# Patient Record
Sex: Female | Born: 1965 | Race: White | Hispanic: No | Marital: Married | Smoking: Never smoker
Health system: Southern US, Community
[De-identification: ages and names within clinical notes are randomized; demographics above are authoritative.]

## PROBLEM LIST (undated history)

## (undated) DIAGNOSIS — Z803 Family history of malignant neoplasm of breast: Principal | ICD-10-CM

## (undated) DIAGNOSIS — B019 Varicella without complication: Secondary | ICD-10-CM

## (undated) DIAGNOSIS — R87619 Unspecified abnormal cytological findings in specimens from cervix uteri: Secondary | ICD-10-CM

## (undated) DIAGNOSIS — Z808 Family history of malignant neoplasm of other organs or systems: Secondary | ICD-10-CM

## (undated) DIAGNOSIS — G43909 Migraine, unspecified, not intractable, without status migrainosus: Secondary | ICD-10-CM

## (undated) DIAGNOSIS — R002 Palpitations: Secondary | ICD-10-CM

## (undated) DIAGNOSIS — Z8042 Family history of malignant neoplasm of prostate: Secondary | ICD-10-CM

## (undated) DIAGNOSIS — Z8041 Family history of malignant neoplasm of ovary: Secondary | ICD-10-CM

## (undated) DIAGNOSIS — K219 Gastro-esophageal reflux disease without esophagitis: Secondary | ICD-10-CM

## (undated) DIAGNOSIS — T7840XA Allergy, unspecified, initial encounter: Secondary | ICD-10-CM

## (undated) HISTORY — PX: OTHER SURGICAL HISTORY: SHX169

## (undated) HISTORY — DX: Unspecified abnormal cytological findings in specimens from cervix uteri: R87.619

## (undated) HISTORY — PX: CERVICAL BIOPSY  W/ LOOP ELECTRODE EXCISION: SUR135

## (undated) HISTORY — DX: Family history of malignant neoplasm of other organs or systems: Z80.8

## (undated) HISTORY — DX: Family history of malignant neoplasm of breast: Z80.3

## (undated) HISTORY — DX: Palpitations: R00.2

## (undated) HISTORY — DX: Family history of malignant neoplasm of ovary: Z80.41

## (undated) HISTORY — DX: Family history of malignant neoplasm of prostate: Z80.42

## (undated) HISTORY — DX: Migraine, unspecified, not intractable, without status migrainosus: G43.909

## (undated) HISTORY — DX: Gastro-esophageal reflux disease without esophagitis: K21.9

## (undated) HISTORY — DX: Varicella without complication: B01.9

## (undated) HISTORY — DX: Allergy, unspecified, initial encounter: T78.40XA

---

## 2000-03-04 HISTORY — PX: OOPHORECTOMY: SHX86

## 2014-11-24 ENCOUNTER — Ambulatory Visit (INDEPENDENT_AMBULATORY_CARE_PROVIDER_SITE_OTHER): Payer: BLUE CROSS/BLUE SHIELD | Admitting: Family Medicine

## 2014-11-24 ENCOUNTER — Encounter: Payer: Self-pay | Admitting: Family Medicine

## 2014-11-24 ENCOUNTER — Other Ambulatory Visit (HOSPITAL_COMMUNITY)
Admission: RE | Admit: 2014-11-24 | Discharge: 2014-11-24 | Disposition: A | Discharge status: Home / Self Care | Payer: BLUE CROSS/BLUE SHIELD | Source: Ambulatory Visit | Attending: Family Medicine | Admitting: Family Medicine

## 2014-11-24 ENCOUNTER — Other Ambulatory Visit: Payer: Self-pay | Admitting: Family Medicine

## 2014-11-24 VITALS — BP 101/68 | HR 80 | Temp 97.2°F | Resp 16 | Ht 62.0 in | Wt 125.0 lb

## 2014-11-24 DIAGNOSIS — Z0000 Encounter for general adult medical examination without abnormal findings: Secondary | ICD-10-CM

## 2014-11-24 DIAGNOSIS — Z23 Encounter for immunization: Secondary | ICD-10-CM | POA: Diagnosis not present

## 2014-11-24 DIAGNOSIS — Z1151 Encounter for screening for human papillomavirus (HPV): Secondary | ICD-10-CM | POA: Insufficient documentation

## 2014-11-24 DIAGNOSIS — Z01411 Encounter for gynecological examination (general) (routine) with abnormal findings: Secondary | ICD-10-CM | POA: Insufficient documentation

## 2014-11-24 DIAGNOSIS — Z418 Encounter for other procedures for purposes other than remedying health state: Secondary | ICD-10-CM | POA: Diagnosis not present

## 2014-11-24 DIAGNOSIS — R002 Palpitations: Secondary | ICD-10-CM | POA: Diagnosis not present

## 2014-11-24 DIAGNOSIS — Z7689 Persons encountering health services in other specified circumstances: Secondary | ICD-10-CM

## 2014-11-24 DIAGNOSIS — Z124 Encounter for screening for malignant neoplasm of cervix: Secondary | ICD-10-CM | POA: Diagnosis not present

## 2014-11-24 DIAGNOSIS — Z7189 Other specified counseling: Secondary | ICD-10-CM

## 2014-11-24 DIAGNOSIS — Z1231 Encounter for screening mammogram for malignant neoplasm of breast: Secondary | ICD-10-CM

## 2014-11-24 DIAGNOSIS — Z299 Encounter for prophylactic measures, unspecified: Secondary | ICD-10-CM | POA: Insufficient documentation

## 2014-11-24 NOTE — Patient Instructions (Signed)

## 2014-11-24 NOTE — Progress Notes (Signed)
Pre visit review using our clinic review tool, if applicable. No additional management support is needed unless otherwise documented below in the visit note. 

## 2014-11-24 NOTE — Progress Notes (Signed)
Subjective:    Patient ID: Carla Watts, female    DOB: 06-Nov-1965, 49 y.o.   MRN: [id number]  HPI Patient presents for new patient establishment with preventative visit. All patient's past medical history, social history, family history, surgical history and allergies were entered into the electronic medical medical record today. Prior PCP records were received and reviewed.   Palpitations: Patient reports intermittent palpitations, that she felt was getting worse a few months ago. She denies any chest pain, dizziness, presyncope or syncope. She states that she doesn't drink a lot of caffeine. She does admit that she was under more anxiety at the time going through you instructor course. She does have a family history of thyroid disease, however by record review her TSH was checked 2 years ago and was normal.  Well woman/cervical cancer screening:  Patient's last menstrual period was 11/11/2014. she reports her periods come regularly, every 28-30 days, lasting about 5 days and are not heavy. She does not use any form of birth control. Her husband had a vasectomy. She is married. He is a gravida 5 para 3, youngest child is 63. Patient does not routinely perform self breast exams. Her last Pap smear within 2014 and was normal. He states that she has had all normal Pap smears since her early 58s. She did have a LEEP cone in her early 20s. She has had HIV testing with her pregnancy. Her last mammogram was in 2014 and was normal. She had had a abnormal mammogram in 2013, that was followed up on and was found to be benign axillary lymph nodes. Patient has no family history of breast or colon cancer. Patient does have a family history of ovarian cancer. Patient has had a right oophorectomy  secondary to cyst that was benign.   Past Medical History  Diagnosis Date  . Chicken pox   . GERD (gastroesophageal reflux disease)    Allergies  Allergen Reactions  . Penicillins Rash   Past Surgical History    Procedure Laterality Date  . Oophorectomy Right 2002  . Cervical biopsy  w/ loop electrode excision    . Cesarean section    . Laparoscopy of uterus     Family History  Problem Relation Watts of Onset  . Hypertension Mother   . Thyroid disease Mother   . Stroke Father   . Heart attack Maternal Grandfather   . Heart disease Maternal Grandfather 101    Died of MI  . Arthritis Maternal Grandmother     RA  . Thyroid disease Maternal Grandmother   . Alcohol abuse Paternal Grandfather   . Ovarian cancer Paternal Aunt   . Brain cancer Paternal Uncle   . Thyroid disease Maternal Aunt    Social History   Social History  . Marital Status: Married    Spouse Name: N/A  . Number of Children: N/A  . Years of Education: N/A   Occupational History  . health fitness coach     yoga instructor   Social History Main Topics  . Smoking status: Never Smoker   . Smokeless tobacco: Never Used  . Alcohol Use: Yes     Comment: socially  . Drug Use: No  . Sexual Activity: Yes     Comment: husband with vasectomy   Other Topics Concern  . Not on file   Social History Narrative   Married, 3 children.   - drinks some caffeine.   - uses herbal remedies at time.    - Wears  a seatbelt, exercises 3x/w, smoke alarm in the home   - Guns in the home, locked case   - Feels safe in relationship    Health maintenance:  Colonoscopy: No fhx, routine screening to start at 50y Mammogram: Last mammogram 2014, normal. There was benign findings in 2013 mammogram, that was followed up on. Felt to be benign lymph nodes in the axillary region. Cervical cancer screening: Last Pap smear 2014, normal. Immunizations: Flu vaccination and tetanus vaccination ordered today. Infectious disease screening: HIV completed with last pregnancy, negative.  Review of Systems  Constitutional: Negative for fever, activity change, appetite change, fatigue and unexpected weight change.  HENT: Negative for ear pain, mouth  sores, sore throat, tinnitus, trouble swallowing and voice change.   Eyes: Negative for photophobia and visual disturbance.  Respiratory: Positive for shortness of breath. Negative for cough, choking, chest tightness and wheezing.   Cardiovascular: Positive for palpitations. Negative for chest pain and leg swelling.  Gastrointestinal: Negative for abdominal pain, diarrhea, constipation, blood in stool and rectal pain.  Endocrine: Negative for cold intolerance, heat intolerance and polyuria.  Genitourinary: Negative for decreased urine volume, difficulty urinating, vaginal pain and menstrual problem.  Musculoskeletal: Negative for myalgias, back pain, arthralgias and gait problem.  Skin: Negative for rash.  Allergic/Immunologic: Negative for environmental allergies and food allergies.  Neurological: Negative for dizziness, syncope, speech difficulty, weakness, light-headedness and headaches.  Hematological: Negative for adenopathy.  Psychiatric/Behavioral: Negative for hallucinations, confusion, sleep disturbance, decreased concentration and agitation. The patient is not nervous/anxious.       Objective:   Physical Exam BP 101/68 mmHg  Pulse 80  Temp(Src) 97.2 F (36.2 C) (Oral)  Resp 16  Ht  (1.575 m)  Wt 125 lb (56.7 kg)  BMI 22.86 kg/m2  SpO2 99%  LMP 11/11/2014 Gen: Afebrile. No acute distress. Non-toxic in appearance, physically fit caucasian female.  HENT: AT. [place]. Bilateral TM visualized and normal in appearance. MMM. Bilateral nares without erythema or swelling. Throat without erythema or exudates.  Eyes:Pupils Equal Round Reactive to light, Extraocular movements intact,  Conjunctiva without redness, discharge or icterus. Neck/lymp/endocrine: Supple,no lymphadenopathy, no thyromegaly CV: RRR no murmur, no edema, +2/4 P posterior tibialis pulses Chest: CTAB, no wheeze or crackles Abd: Soft. flat. NTND. BS present, no Masses palpated.  Skin: No rashes, purpura or petechiae.   Neuro/MSK: Normal gait. PERLA. EOMi. Alert. Oriented x3 Cranial nerves II through XII intact. Muscle strength 5/5 UE/LE extremity. DTRs equal bilaterally. Psych: Normal affect, dress and demeanor. Normal speech. Normal thought content and judgment.  GYN:  External genitalia within normal limits.  Vaginal mucosa pink, moist, normal rugae.  Nonfriable cervix without lesions, mild white thick discharge, mildly friable cervix with nabothian cyst 11o'clock positron.   Bimanual exam revealed normal, nongravid uterus.  No cervical motion tenderness. No adnexal masses bilaterally.      Assessment & Plan:  1. Preventive measure Colonoscopy: No fhx, routine screening to start at 50y Mammogram: Last mammogram 2014, normal. There was benign findings in 2013 mammogram, that was followed up on. Felt to be benign lymph nodes in the axillary region. Mammogram ordered today. Cervical cancer screening: Last Pap smear 2014, normal. Pap smear completed today, with HPV. Immunizations: tdap administered today, patient declined flu shot. Infectious disease screening: HIV completed with last pregnancy, negative. - Tdap vaccine greater than or equal to 7yo IM - Lipid panel; Future - Vit D  25 hydroxy (rtn osteoporosis monitoring); Future - MM DIGITAL SCREENING BILATERAL; Future  2.  Palpitations - Family history of thyroid disorder, doesn't appear to drink much caffeine. No alarming symptoms, and no current symptoms. Discussed causes of palpitations with patient today and alarm signs to look for if she starts to get palpitations again. We'll do some lab work today to rule out thyroid disorder. - TSH; Future - CBC w/Diff; Future - Comprehensive metabolic panel; Future  Follow-up in one year, sooner if needed or as lab results indicate.

## 2014-11-25 ENCOUNTER — Other Ambulatory Visit (INDEPENDENT_AMBULATORY_CARE_PROVIDER_SITE_OTHER): Payer: BLUE CROSS/BLUE SHIELD

## 2014-11-25 ENCOUNTER — Telehealth: Payer: Self-pay | Admitting: Family Medicine

## 2014-11-25 DIAGNOSIS — R002 Palpitations: Secondary | ICD-10-CM | POA: Diagnosis not present

## 2014-11-25 DIAGNOSIS — Z299 Encounter for prophylactic measures, unspecified: Secondary | ICD-10-CM

## 2014-11-25 DIAGNOSIS — Z418 Encounter for other procedures for purposes other than remedying health state: Secondary | ICD-10-CM

## 2014-11-25 DIAGNOSIS — E559 Vitamin D deficiency, unspecified: Secondary | ICD-10-CM

## 2014-11-25 LAB — LIPID PANEL
CHOLESTEROL: 161 mg/dL (ref 0–200)
HDL: 84.2 mg/dL (ref >39.00)
LDL Cholesterol: 67 mg/dL (ref 0–99)
NONHDL: 76.92
TRIGLYCERIDES: 48 mg/dL (ref 0.0–149.0)
Total CHOL/HDL Ratio: 2
VLDL: 9.6 mg/dL (ref 0.0–40.0)

## 2014-11-25 LAB — CBC WITH DIFFERENTIAL/PLATELET
BASOS ABS: 0 10*3/uL (ref 0.0–0.1)
Basophils Relative: 0.7 % (ref 0.0–3.0)
EOS PCT: 0.9 % (ref 0.0–5.0)
Eosinophils Absolute: 0.1 10*3/uL (ref 0.0–0.7)
HCT: 40.9 % (ref 36.0–46.0)
Hemoglobin: 13.4 g/dL (ref 12.0–15.0)
LYMPHS ABS: 2.1 10*3/uL (ref 0.7–4.0)
Lymphocytes Relative: 31.6 % (ref 12.0–46.0)
MCHC: 32.7 g/dL (ref 30.0–36.0)
MCV: 90.1 fl (ref 78.0–100.0)
MONO ABS: 0.6 10*3/uL (ref 0.1–1.0)
MONOS PCT: 8.9 % (ref 3.0–12.0)
NEUTROS ABS: 3.9 10*3/uL (ref 1.4–7.7)
NEUTROS PCT: 57.9 % (ref 43.0–77.0)
PLATELETS: 239 10*3/uL (ref 150.0–400.0)
RBC: 4.54 Mil/uL (ref 3.87–5.11)
RDW: 13.2 % (ref 11.5–15.5)
WBC: 6.7 10*3/uL (ref 4.0–10.5)

## 2014-11-25 LAB — COMPREHENSIVE METABOLIC PANEL
ALBUMIN: 4.3 g/dL (ref 3.5–5.2)
ALT: 10 U/L (ref 0–35)
AST: 16 U/L (ref 0–37)
Alkaline Phosphatase: 48 U/L (ref 39–117)
BUN: 12 mg/dL (ref 6–23)
CALCIUM: 9.3 mg/dL (ref 8.4–10.5)
CHLORIDE: 105 meq/L (ref 96–112)
CO2: 27 meq/L (ref 19–32)
Creatinine, Ser: 0.78 mg/dL (ref 0.40–1.20)
GFR: 83.76 mL/min (ref >60.00)
Glucose, Bld: 93 mg/dL (ref 70–99)
POTASSIUM: 4.6 meq/L (ref 3.5–5.1)
Sodium: 139 mEq/L (ref 135–145)
Total Bilirubin: 0.5 mg/dL (ref 0.2–1.2)
Total Protein: 6.7 g/dL (ref 6.0–8.3)

## 2014-11-25 LAB — TSH: TSH: 1.46 u[IU]/mL (ref 0.35–4.50)

## 2014-11-25 LAB — VITAMIN D 25 HYDROXY (VIT D DEFICIENCY, FRACTURES): VITD: 26.09 ng/mL — AB (ref 30.00–100.00)

## 2014-11-25 MED ORDER — VITAMIN D (ERGOCALCIFEROL) 1.25 MG (50000 UNIT) PO CAPS: 50000.0000 [IU] | ORAL_CAPSULE | ORAL | Status: DC

## 2014-11-25 NOTE — Telephone Encounter (Signed)
Please call patient, her vitamin D is low at 26. I have called in a prescription a vitamin D for her to take 1 pill every 7 days for 12 weeks. We would then retest her vitamin D level. All of her other lab work was perfect.   Her cervical cancer screening has not returned , we will call her once it has result.She is not signed up for my chart, and she may need copies of these for her insurance.

## 2014-11-28 LAB — CYTOLOGY - PAP

## 2014-11-28 NOTE — Telephone Encounter (Signed)
LMOM for pt to CB for results. 

## 2014-11-28 NOTE — Telephone Encounter (Signed)
Patient aware of results and new RX.  No questions at this time.

## 2014-11-29 ENCOUNTER — Telehealth: Payer: Self-pay | Admitting: Family Medicine

## 2014-11-29 NOTE — Telephone Encounter (Signed)
Patient aware of results. No questions at this time.

## 2014-11-29 NOTE — Telephone Encounter (Signed)
Please call pt: her PAP was normal , with negative HPV. This means her next PAP is in 3 years.

## 2014-12-23 ENCOUNTER — Ambulatory Visit
Admission: RE | Admit: 2014-12-23 | Discharge: 2014-12-23 | Disposition: A | Discharge status: Home / Self Care | Payer: BLUE CROSS/BLUE SHIELD | Source: Ambulatory Visit | Attending: Family Medicine | Admitting: Family Medicine

## 2014-12-23 DIAGNOSIS — Z1231 Encounter for screening mammogram for malignant neoplasm of breast: Secondary | ICD-10-CM

## 2014-12-28 ENCOUNTER — Telehealth: Payer: Self-pay | Admitting: Family Medicine

## 2014-12-28 NOTE — Telephone Encounter (Signed)
Can we check on this mammogram result? It states "exam ended" 5 days ago, but I am unable to retrieve the result.

## 2014-12-28 NOTE — Telephone Encounter (Signed)
[facility] results have not been dictated yet. Dr Claiborne BillingsKuneff notified

## 2015-01-03 ENCOUNTER — Other Ambulatory Visit: Payer: Self-pay | Admitting: Family Medicine

## 2015-01-03 DIAGNOSIS — R928 Other abnormal and inconclusive findings on diagnostic imaging of breast: Secondary | ICD-10-CM

## 2015-01-17 ENCOUNTER — Ambulatory Visit
Admission: RE | Admit: 2015-01-17 | Discharge: 2015-01-17 | Disposition: A | Discharge status: Home / Self Care | Payer: BLUE CROSS/BLUE SHIELD | Source: Ambulatory Visit | Attending: Family Medicine | Admitting: Family Medicine

## 2015-01-17 DIAGNOSIS — R928 Other abnormal and inconclusive findings on diagnostic imaging of breast: Secondary | ICD-10-CM

## 2015-07-05 ENCOUNTER — Encounter: Payer: Self-pay | Admitting: Family Medicine

## 2015-07-05 ENCOUNTER — Ambulatory Visit (INDEPENDENT_AMBULATORY_CARE_PROVIDER_SITE_OTHER): Payer: BLUE CROSS/BLUE SHIELD | Admitting: Family Medicine

## 2015-07-05 VITALS — BP 110/70 | HR 82 | Temp 98.0°F | Resp 16 | Wt 125.2 lb

## 2015-07-05 DIAGNOSIS — M25579 Pain in unspecified ankle and joints of unspecified foot: Secondary | ICD-10-CM | POA: Insufficient documentation

## 2015-07-05 DIAGNOSIS — M25572 Pain in left ankle and joints of left foot: Secondary | ICD-10-CM

## 2015-07-05 DIAGNOSIS — M7672 Peroneal tendinitis, left leg: Secondary | ICD-10-CM | POA: Diagnosis not present

## 2015-07-05 MED ORDER — MELOXICAM 15 MG PO TABS: 15.0000 mg | ORAL_TABLET | Freq: Every day | ORAL | Status: DC

## 2015-07-05 NOTE — Assessment & Plan Note (Signed)
New.  Pt's pain is localized over L peronal tendons and consistent w/ inflammation.  Start daily mobic.  Encouraged supportive shoes,ice prn.  If no improvement, will refer to sports med.  Pt expressed understanding and is in agreement w/ plan.

## 2015-07-05 NOTE — Assessment & Plan Note (Signed)
New.  Pt w/ new area of soft tissue swelling on dorsum of foot just distal to the ankle mortis.  Get US to assess but this seems most consistent w/ ganglion cyst.  Pt expressed understanding and is in agreement w/ plan.

## 2015-07-05 NOTE — Progress Notes (Signed)
Pre visit review using our clinic review tool, if applicable. No additional management support is needed unless otherwise documented below in the visit note. 

## 2015-07-05 NOTE — Progress Notes (Signed)
   Subjective:    Patient ID: Carla Watts Agehristine Watts, female    DOB: 12/10/1965, 50 y.o.   MRN: [id number]  HPI L ankle pain- pt reports 'there is pain that shouldn't be there'.  sxs started 'a couple of months ago'.  No restriction in motion.  This weekend developed a tender lump on the top of her foot near the ankle mortis.  Pt reports increased cracking and popping but this is painless.  No recent change in footwear.  No change in activity level.  Pt notes very mild swelling.  No pain w/ weight bearing.  Most painful when doing yoga and having to tuck foot underneath her (foot in inverted position)  Pt has not taken any OTC meds for pain.    Review of Systems For ROS see HPI     Objective:   Physical Exam  Constitutional: She is oriented to person, place, and time. She appears well-developed and well-nourished. No distress.  Cardiovascular: Intact distal pulses.   Musculoskeletal: She exhibits tenderness (mild TTP over L peronal tendons.  no TTP over medial or lateral malleolus.  mild TTP over soft tissue mass on dorsum of foot just distal to the ankle mortis- consistent w/ ganglion cyst). She exhibits no edema.  Full ROM of L ankle w/ pain  Neurological: She is alert and oriented to person, place, and time. Coordination normal.  Skin: Skin is warm and dry. No rash noted. No erythema.  Psychiatric: She has a normal mood and affect. Her behavior is normal. Thought content normal.  Vitals reviewed.         Assessment & Plan:

## 2015-07-05 NOTE — Patient Instructions (Signed)
Follow up as needed Take the Mobic once daily- take w/ food- for 7-10 days and then as needed for pain Wear good supportive shoes to allow the inflammation to calm down We'll call you with your ultrasound appt to assess the top of the foot- my best guess is a ganglion cyst ICE for pain as needed Call with any questions or concerns Hang in there!!!

## 2015-07-07 DIAGNOSIS — M9902 Segmental and somatic dysfunction of thoracic region: Secondary | ICD-10-CM | POA: Diagnosis not present

## 2015-07-07 DIAGNOSIS — M9901 Segmental and somatic dysfunction of cervical region: Secondary | ICD-10-CM | POA: Diagnosis not present

## 2015-07-07 DIAGNOSIS — M531 Cervicobrachial syndrome: Secondary | ICD-10-CM | POA: Diagnosis not present

## 2015-07-07 DIAGNOSIS — M791 Myalgia: Secondary | ICD-10-CM | POA: Diagnosis not present

## 2015-08-08 ENCOUNTER — Telehealth: Payer: Self-pay | Admitting: *Deleted

## 2015-08-08 ENCOUNTER — Ambulatory Visit (INDEPENDENT_AMBULATORY_CARE_PROVIDER_SITE_OTHER): Payer: BLUE CROSS/BLUE SHIELD | Admitting: Family Medicine

## 2015-08-08 ENCOUNTER — Encounter: Payer: Self-pay | Admitting: Family Medicine

## 2015-08-08 VITALS — BP 90/62 | HR 62 | Temp 98.2°F | Resp 18 | Ht 62.0 in | Wt 120.5 lb

## 2015-08-08 DIAGNOSIS — T148 Other injury of unspecified body region: Secondary | ICD-10-CM

## 2015-08-08 DIAGNOSIS — W57XXXA Bitten or stung by nonvenomous insect and other nonvenomous arthropods, initial encounter: Secondary | ICD-10-CM | POA: Diagnosis not present

## 2015-08-08 MED ORDER — DOXYCYCLINE HYCLATE 100 MG PO TABS: ORAL_TABLET | ORAL | Status: DC

## 2015-08-08 NOTE — Patient Instructions (Signed)
Tick Bite Information Ticks are insects that attach themselves to the skin and draw blood for food. There are various types of ticks. Common types include wood ticks and deer ticks. Most ticks live in shrubs and grassy areas. Ticks can climb onto your body when you make contact with leaves or grass where the tick is waiting. The most common places on the body for ticks to attach themselves are the scalp, neck, armpits, waist, and groin. Most tick bites are harmless, but sometimes ticks carry germs that cause diseases. These germs can be spread to a person during the tick's feeding process. The chance of a disease spreading through a tick bite depends on:   The type of tick.  Time of year.   How long the tick is attached.   Geographic location.  HOW CAN YOU PREVENT TICK BITES? Take these steps to help prevent tick bites when you are outdoors:  Wear protective clothing. Long sleeves and long pants are best.   Wear white clothes so you can see ticks more easily.  Tuck your pant legs into your socks.   If walking on a trail, stay in the middle of the trail to avoid brushing against bushes.  Avoid walking through areas with long grass.  Put insect repellent on all exposed skin and along boot tops, pant legs, and sleeve cuffs.   Check clothing, hair, and skin repeatedly and before going inside.   Brush off any ticks that are not attached.  Take a shower or bath as soon as possible after being outdoors.  WHAT IS THE PROPER WAY TO REMOVE A TICK? Ticks should be removed as soon as possible to help prevent diseases caused by tick bites. 1. If latex gloves are available, put them on before trying to remove a tick.  2. Using fine-point tweezers, grasp the tick as close to the skin as possible. You may also use curved forceps or a tick removal tool. Grasp the tick as close to its head as possible. Avoid grasping the tick on its body. 3. Pull gently with steady upward pressure until  the tick lets go. Do not twist the tick or jerk it suddenly. This may break off the tick's head or mouth parts. 4. Do not squeeze or crush the tick's body. This could force disease-carrying fluids from the tick into your body.  5. After the tick is removed, wash the bite area and your hands with soap and water or other disinfectant such as alcohol. 6. Apply a small amount of antiseptic cream or ointment to the bite site.  7. Wash and disinfect any instruments that were used.  Do not try to remove a tick by applying a hot match, petroleum jelly, or fingernail polish to the tick. These methods do not work and may increase the chances of disease being spread from the tick bite.  WHEN SHOULD YOU SEEK MEDICAL CARE? Contact your health care provider if you are unable to remove a tick from your skin or if a part of the tick breaks off and is stuck in the skin.  After a tick bite, you need to be aware of signs and symptoms that could be related to diseases spread by ticks. Contact your health care provider if you develop any of the following in the days or weeks after the tick bite:  Unexplained fever.  Rash. A circular rash that appears days or weeks after the tick bite may indicate the possibility of Lyme disease. The rash may resemble   a target with a bull's-eye and may occur at a different part of your body than the tick bite.  Redness and swelling in the area of the tick bite.   Tender, swollen lymph glands.   Diarrhea.   Weight loss.   Cough.   Fatigue.   Muscle, joint, or bone pain.   Abdominal pain.   Headache.   Lethargy or a change in your level of consciousness.  Difficulty walking or moving your legs.   Numbness in the legs.   Paralysis.  Shortness of breath.   Confusion.   Repeated vomiting.    This information is not intended to replace advice given to you by your health care provider. Make sure you discuss any questions you have with your health  care provider.   Document Released: 02/16/2000 Document Revised: 03/11/2014 Document Reviewed: 07/29/2012 Elsevier Interactive Patient Education 2016 [employer]  

## 2015-08-08 NOTE — Telephone Encounter (Signed)
Patient called left message stating she had a tick bite last night. Her husband got the tick off and the head out. She is requesting antibiotics. She states it said on the news you should call your Dr and get antibiotics if bitten by a tick. Patient scheduled to be seen today.

## 2015-08-08 NOTE — Progress Notes (Signed)
Patient ID: Carla Watts, female   DOB: Jul 06, 1965, 50 y.o.   MRN: [id number]    Carla Watts , 1965-05-22, 50 y.o., female MRN: [id number]  CC: Tick bite Subjective: Pt presents for an acute OV with complaints of tick bite of 1 day duration. No Associated symptoms. Patient reports she noticed the tick on her right breast last night and her husband removed the tick with tweezers. The tick was not engorged, however the head of the insect separated from the body. They felt they were able to get the remaining pieces of the insect removed. She denies rash, redness, fever, chills, drainage, or headache. She does not feel the tick was on her skin more than 24 hours, but is uncertain. She was not outside working, but her husband had been over the weekend and she has 2 dogs that are indoor/outdoor.    Allergies  Allergen Reactions  . Penicillins Rash   Social History  Substance Use Topics  . Smoking status: Never Smoker   . Smokeless tobacco: Never Used  . Alcohol Use: Yes     Comment: socially   Past Medical History  Diagnosis Date  . Chicken pox   . GERD (gastroesophageal reflux disease)    Past Surgical History  Procedure Laterality Date  . Oophorectomy Right 2002  . Cervical biopsy  w/ loop electrode excision    . Cesarean section    . Laparoscopy of uterus     Family History  Problem Relation Age of Onset  . Hypertension Mother   . Thyroid disease Mother   . Stroke Father   . Heart attack Maternal Grandfather   . Heart disease Maternal Grandfather 9    Died of MI  . Arthritis Maternal Grandmother     RA  . Thyroid disease Maternal Grandmother   . Alcohol abuse Paternal Grandfather   . Ovarian cancer Paternal Aunt   . Brain cancer Paternal Uncle   . Thyroid disease Maternal Aunt      Medication List       This list is accurate as of: 08/08/15  3:16 PM.  Always use your most recent med list.               meloxicam 15 MG tablet  Commonly known as:  MOBIC  Take  1 tablet (15 mg total) by mouth daily.         ROS: Negative, with the exception of above mentioned in HPI   Objective:  BP 90/62 mmHg  Pulse 62  Temp(Src) 98.2 F (36.8 C)  Resp 18  Ht  (1.575 m)  Wt 120 lb 8 oz (54.658 kg)  BMI 22.03 kg/m2  SpO2 96%  LMP 07/31/2015 Body mass index is 22.03 kg/(m^2). Gen: Afebrile. No acute distress. Nontoxic in appearance, well developed. well nourished. Pleasant female.  HENT: AT. [place].  MMM, no oral lesions.  Eyes:Pupils Equal Round Reactive to light, Extraocular movements intact,  Conjunctiva without redness, discharge or icterus. Skin: No rashes, purpura or petechiae. Small insect bite right areolar. No redness or drainage. No remnants of insect identified.  Neuro: Normal gait. PERLA. EOMi. Alert. Oriented x3   Assessment/Plan: Carla Watts is a 50 y.o. female present for acute OV for  Tick bite - Tick bite right areolar, does not appear infected.  - Doxy 200 mg PO once.  - doxycycline (VIBRA-TABS) 100 MG tablet; 200 mg PO once  Dispense: 2 tablet; Refill: 0 - AVS on tick bites/prevention and emergent signs.  -  F/u PRN   electronically signed by:  Felix Pacinienee Kuneff, DO   [facility] [place]

## 2015-09-14 ENCOUNTER — Ambulatory Visit (HOSPITAL_BASED_OUTPATIENT_CLINIC_OR_DEPARTMENT_OTHER): Payer: BLUE CROSS/BLUE SHIELD

## 2015-09-15 DIAGNOSIS — M531 Cervicobrachial syndrome: Secondary | ICD-10-CM | POA: Diagnosis not present

## 2015-09-15 DIAGNOSIS — M9901 Segmental and somatic dysfunction of cervical region: Secondary | ICD-10-CM | POA: Diagnosis not present

## 2015-09-15 DIAGNOSIS — M9902 Segmental and somatic dysfunction of thoracic region: Secondary | ICD-10-CM | POA: Diagnosis not present

## 2015-09-15 DIAGNOSIS — M791 Myalgia: Secondary | ICD-10-CM | POA: Diagnosis not present

## 2015-12-06 DIAGNOSIS — M791 Myalgia: Secondary | ICD-10-CM | POA: Diagnosis not present

## 2015-12-06 DIAGNOSIS — M531 Cervicobrachial syndrome: Secondary | ICD-10-CM | POA: Diagnosis not present

## 2015-12-06 DIAGNOSIS — M9901 Segmental and somatic dysfunction of cervical region: Secondary | ICD-10-CM | POA: Diagnosis not present

## 2015-12-06 DIAGNOSIS — M9902 Segmental and somatic dysfunction of thoracic region: Secondary | ICD-10-CM | POA: Diagnosis not present

## 2015-12-25 DIAGNOSIS — M9902 Segmental and somatic dysfunction of thoracic region: Secondary | ICD-10-CM | POA: Diagnosis not present

## 2015-12-25 DIAGNOSIS — M531 Cervicobrachial syndrome: Secondary | ICD-10-CM | POA: Diagnosis not present

## 2015-12-25 DIAGNOSIS — M9901 Segmental and somatic dysfunction of cervical region: Secondary | ICD-10-CM | POA: Diagnosis not present

## 2015-12-25 DIAGNOSIS — R51 Headache: Secondary | ICD-10-CM | POA: Diagnosis not present

## 2015-12-29 DIAGNOSIS — R51 Headache: Secondary | ICD-10-CM | POA: Diagnosis not present

## 2015-12-29 DIAGNOSIS — M9902 Segmental and somatic dysfunction of thoracic region: Secondary | ICD-10-CM | POA: Diagnosis not present

## 2015-12-29 DIAGNOSIS — M9901 Segmental and somatic dysfunction of cervical region: Secondary | ICD-10-CM | POA: Diagnosis not present

## 2015-12-29 DIAGNOSIS — M531 Cervicobrachial syndrome: Secondary | ICD-10-CM | POA: Diagnosis not present

## 2016-01-05 DIAGNOSIS — M9902 Segmental and somatic dysfunction of thoracic region: Secondary | ICD-10-CM | POA: Diagnosis not present

## 2016-01-05 DIAGNOSIS — M9901 Segmental and somatic dysfunction of cervical region: Secondary | ICD-10-CM | POA: Diagnosis not present

## 2016-01-05 DIAGNOSIS — R51 Headache: Secondary | ICD-10-CM | POA: Diagnosis not present

## 2016-01-05 DIAGNOSIS — M531 Cervicobrachial syndrome: Secondary | ICD-10-CM | POA: Diagnosis not present

## 2016-01-10 ENCOUNTER — Telehealth: Payer: Self-pay | Admitting: Family Medicine

## 2016-01-10 NOTE — Telephone Encounter (Signed)
Phone note in error °

## 2016-01-12 ENCOUNTER — Encounter: Payer: Self-pay | Admitting: Family Medicine

## 2016-01-12 ENCOUNTER — Ambulatory Visit (INDEPENDENT_AMBULATORY_CARE_PROVIDER_SITE_OTHER): Payer: BLUE CROSS/BLUE SHIELD | Admitting: Family Medicine

## 2016-01-12 VITALS — BP 118/83 | HR 71 | Temp 98.6°F | Resp 20 | Ht 62.0 in | Wt 124.5 lb

## 2016-01-12 DIAGNOSIS — M9901 Segmental and somatic dysfunction of cervical region: Secondary | ICD-10-CM | POA: Diagnosis not present

## 2016-01-12 DIAGNOSIS — Z131 Encounter for screening for diabetes mellitus: Secondary | ICD-10-CM

## 2016-01-12 DIAGNOSIS — Z0000 Encounter for general adult medical examination without abnormal findings: Secondary | ICD-10-CM

## 2016-01-12 DIAGNOSIS — M7672 Peroneal tendinitis, left leg: Secondary | ICD-10-CM | POA: Diagnosis not present

## 2016-01-12 DIAGNOSIS — E559 Vitamin D deficiency, unspecified: Secondary | ICD-10-CM | POA: Diagnosis not present

## 2016-01-12 DIAGNOSIS — Z130 Encounter for screening for diseases of the blood and blood-forming organs and certain disorders involving the immune mechanism: Secondary | ICD-10-CM | POA: Diagnosis not present

## 2016-01-12 DIAGNOSIS — Z13220 Encounter for screening for lipoid disorders: Secondary | ICD-10-CM

## 2016-01-12 DIAGNOSIS — M9902 Segmental and somatic dysfunction of thoracic region: Secondary | ICD-10-CM | POA: Diagnosis not present

## 2016-01-12 DIAGNOSIS — Z1329 Encounter for screening for other suspected endocrine disorder: Secondary | ICD-10-CM

## 2016-01-12 DIAGNOSIS — R51 Headache: Secondary | ICD-10-CM | POA: Diagnosis not present

## 2016-01-12 DIAGNOSIS — Z1231 Encounter for screening mammogram for malignant neoplasm of breast: Secondary | ICD-10-CM

## 2016-01-12 DIAGNOSIS — M531 Cervicobrachial syndrome: Secondary | ICD-10-CM | POA: Diagnosis not present

## 2016-01-12 LAB — LIPID PANEL
CHOL/HDL RATIO: 2
Cholesterol: 180 mg/dL (ref 0–200)
HDL: 96.4 mg/dL (ref >39.00)
LDL CALC: 76 mg/dL (ref 0–99)
NonHDL: 83.11
TRIGLYCERIDES: 38 mg/dL (ref 0.0–149.0)
VLDL: 7.6 mg/dL (ref 0.0–40.0)

## 2016-01-12 LAB — COMPREHENSIVE METABOLIC PANEL
ALT: 11 U/L (ref 0–35)
AST: 20 U/L (ref 0–37)
Albumin: 4.3 g/dL (ref 3.5–5.2)
Alkaline Phosphatase: 43 U/L (ref 39–117)
BILIRUBIN TOTAL: 0.6 mg/dL (ref 0.2–1.2)
BUN: 17 mg/dL (ref 6–23)
CO2: 27 meq/L (ref 19–32)
Calcium: 9.5 mg/dL (ref 8.4–10.5)
Chloride: 103 mEq/L (ref 96–112)
Creatinine, Ser: 0.71 mg/dL (ref 0.40–1.20)
GFR: 92.92 mL/min (ref >60.00)
GLUCOSE: 82 mg/dL (ref 70–99)
Potassium: 4.3 mEq/L (ref 3.5–5.1)
SODIUM: 139 meq/L (ref 135–145)
Total Protein: 6.8 g/dL (ref 6.0–8.3)

## 2016-01-12 LAB — CBC WITH DIFFERENTIAL/PLATELET
BASOS ABS: 0 10*3/uL (ref 0.0–0.1)
Basophils Relative: 0.7 % (ref 0.0–3.0)
EOS ABS: 0.1 10*3/uL (ref 0.0–0.7)
Eosinophils Relative: 1.4 % (ref 0.0–5.0)
HCT: 39.5 % (ref 36.0–46.0)
Hemoglobin: 13 g/dL (ref 12.0–15.0)
LYMPHS ABS: 2.7 10*3/uL (ref 0.7–4.0)
Lymphocytes Relative: 39 % (ref 12.0–46.0)
MCHC: 33 g/dL (ref 30.0–36.0)
MCV: 88.1 fl (ref 78.0–100.0)
MONO ABS: 0.6 10*3/uL (ref 0.1–1.0)
MONOS PCT: 9.4 % (ref 3.0–12.0)
NEUTROS ABS: 3.4 10*3/uL (ref 1.4–7.7)
NEUTROS PCT: 49.5 % (ref 43.0–77.0)
PLATELETS: 253 10*3/uL (ref 150.0–400.0)
RBC: 4.49 Mil/uL (ref 3.87–5.11)
RDW: 13.1 % (ref 11.5–15.5)
WBC: 6.8 10*3/uL (ref 4.0–10.5)

## 2016-01-12 LAB — VITAMIN D 25 HYDROXY (VIT D DEFICIENCY, FRACTURES): VITD: 26.23 ng/mL — ABNORMAL LOW (ref 30.00–100.00)

## 2016-01-12 LAB — TSH: TSH: 1.06 u[IU]/mL (ref 0.35–4.50)

## 2016-01-12 LAB — HEMOGLOBIN A1C: Hgb A1c MFr Bld: 5.5 % (ref 4.6–6.5)

## 2016-01-12 NOTE — Progress Notes (Signed)
Patient ID: Carla Watts, female  DOB: January 20, 1966, 50 y.o.   MRN: [id number] Patient Care Team    Relationship Specialty Notifications Start End  Ma Hillock, DO PCP - General Family Medicine  11/24/14     Subjective:  Carla Watts is a 50 y.o.  Female  present for CPE. All past medical history, surgical history, allergies, family history, immunizations, medications and social history were updated in the electronic medical record today. All recent labs, ED visits and hospitalizations within the last year were reviewed.  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  She reports a fhx of ovarian cancer in her paternal aunt. She is wondering if there are screening test for ovarian cancer she should be having. She denies bloating, abd pain, weight loss. She has had a right oophorectomy for a dermoid cyst removal in 2002.  She also has an aunt that had an aneurysm. She is wondering if she should have a screen for this as well. Her father had a stroke (sounds basilar) .   Health maintenance:  Colonoscopy: No fhx, routine screening to start at 50y Mammogram: Last mammogram 12/2014, normal after dedicated image in right breast. recs to rpt SCREENING mammogram in 1 year. Order placed today. Completed at breast center.  Cervical cancer screening: Last Pap smear 11/2014, PCP, normal with negative HPV. Rpt 3 years. Does not do SBE routinely.  Immunizations: tdap 01/2016, flu shot 01/2016 Infectious disease screening: HIV completed with last pregnancy, negative. DEXA: N/A Assistive device: None Oxygen use: None Patient has a Dental home. Hospitalizations/ED visits: None  Immunization History  Administered Date(s) Administered  . Influenza-Unspecified 01/04/2016  . MMR 01/08/2016  . Tdap 11/24/2014, 01/04/2016     Past Medical History:  Diagnosis Date  . Chicken pox   . GERD (gastroesophageal reflux disease)    Allergies  Allergen Reactions  .  Penicillins Rash   Past Surgical History:  Procedure Laterality Date  . CERVICAL BIOPSY  W/ LOOP ELECTRODE EXCISION    . CESAREAN SECTION    . laparoscopy of uterus    . OOPHORECTOMY Right 2002   Family History  Problem Relation Age of Onset  . Hypertension Mother   . Thyroid disease Mother   . Stroke Father   . Heart attack Maternal Grandfather   . Heart disease Maternal Grandfather 36    Died of MI  . Arthritis Maternal Grandmother     RA  . Thyroid disease Maternal Grandmother   . Alcohol abuse Paternal Grandfather   . Ovarian cancer Paternal Aunt   . Brain cancer Paternal Uncle   . Thyroid disease Maternal Aunt    Social History   Social History  . Marital status: Married    Spouse name: N/A  . Number of children: N/A  . Years of education: N/A   Occupational History  . health fitness coach     yoga instructor   Social History Main Topics  . Smoking status: Never Smoker  . Smokeless tobacco: Never Used  . Alcohol use Yes     Comment: socially  . Drug use: No  . Sexual activity: Yes     Comment: husband with vasectomy   Other Topics Concern  . Not on file   Social History Narrative   Married, 3 children.   - drinks some caffeine.   - uses herbal remedies at time.    - Wears a seatbelt, exercises 3x/w, smoke alarm in the home   -  Guns in the home, locked case   - Feels safe in relationship     Medication List       Accurate as of 01/12/16 12:19 PM. Always use your most recent med list.          meloxicam 15 MG tablet Commonly known as:  MOBIC Take 1 tablet (15 mg total) by mouth daily.        No results found for this or any previous visit (from the past 2160 hour(s)).  US Breast Ltd Uni Right Inc Axilla  Result Date: 01/17/2015 CLINICAL DATA:  Screening recall for asymmetry seen in the superior right breast on the MLO view only. EXAM: DIGITAL DIAGNOSTIC RIGHT MAMMOGRAM WITH 3D TOMOSYNTHESIS AND CAD RIGHT BREAST ULTRASOUND COMPARISON:   Previous exam(s). ACR Breast Density Category c: The breast tissue is heterogeneously dense, which may obscure small masses. FINDINGS: Cc and MLO tomosynthesis was performed of the right breast. Initially questioned asymmetry in the far superior right breast is similar in appearance when compared to prior mammograms and demonstrates imaging features consistent with asymmetric fibroglandular tissue. Mammographic images were processed with CAD. Physical examination of the upper-outer right breast/low right axilla does not reveal any palpable masses. Targeted ultrasound of the right breast was performed. No discrete masses or abnormalities are seen, only a focal area of dense fibroglandular tissue is seen in the far upper-outer right breast/low right axilla at the approximate 10:30 position 9 o'clock cm from the nipple. This likely corresponds with mammography findings. IMPRESSION: Initially questioned right breast asymmetry corresponds with an asymmetric/focal area of normal fibroglandular tissue. There is no mammographic evidence of malignancy in the right breast. RECOMMENDATION: Screening mammogram in one year.(Code:SM-B-01Y) I have discussed the findings and recommendations with the patient. Results were also provided in writing at the conclusion of the visit. If applicable, a reminder letter will be sent to the patient regarding the next appointment. BI-RADS CATEGORY  1: Negative. Electronically Signed   By: Everlean Alstrom M.D.   On: 01/17/2015 15:41   Mm Diag Breast Tomo Uni Right  Result Date: 01/17/2015 CLINICAL DATA:  Screening recall for asymmetry seen in the superior right breast on the MLO view only. EXAM: DIGITAL DIAGNOSTIC RIGHT MAMMOGRAM WITH 3D TOMOSYNTHESIS AND CAD RIGHT BREAST ULTRASOUND COMPARISON:  Previous exam(s). ACR Breast Density Category c: The breast tissue is heterogeneously dense, which may obscure small masses. FINDINGS: Cc and MLO tomosynthesis was performed of the right breast.  Initially questioned asymmetry in the far superior right breast is similar in appearance when compared to prior mammograms and demonstrates imaging features consistent with asymmetric fibroglandular tissue. Mammographic images were processed with CAD. Physical examination of the upper-outer right breast/low right axilla does not reveal any palpable masses. Targeted ultrasound of the right breast was performed. No discrete masses or abnormalities are seen, only a focal area of dense fibroglandular tissue is seen in the far upper-outer right breast/low right axilla at the approximate 10:30 position 9 o'clock cm from the nipple. This likely corresponds with mammography findings. IMPRESSION: Initially questioned right breast asymmetry corresponds with an asymmetric/focal area of normal fibroglandular tissue. There is no mammographic evidence of malignancy in the right breast. RECOMMENDATION: Screening mammogram in one year.(Code:SM-B-01Y) I have discussed the findings and recommendations with the patient. Results were also provided in writing at the conclusion of the visit. If applicable, a reminder letter will be sent to the patient regarding the next appointment. BI-RADS CATEGORY  1: Negative. Electronically Signed   By: Anderson Malta  Shelly Bombard M.D.   On: 01/17/2015 15:41     ROS: 14 pt review of systems performed and negative (unless mentioned in an HPI)  Objective: BP 118/83 (BP Location: Left Arm, Patient Position: Sitting, Cuff Size: Normal)   Pulse 71   Temp 98.6 F (37 C)   Resp 20   Ht _0  (1.575 m)   Wt 124 lb 8 oz (56.5 kg)   SpO2 99%   BMI 22.77 kg/m  Gen: Afebrile. No acute distress. Nontoxic in appearance, well-developed, well-nourished, pleasant caucasian female.  HENT: AT. . Bilateral TM visualized and normal in appearance, normal external auditory canal. MMM, no oral lesions, adequate dentition. Bilateral nares within normal limits. Throat without erythema, ulcerations or exudates. no  Cough on exam, no hoarseness on exam. Eyes:Pupils Equal Round Reactive to light, Extraocular movements intact,  Conjunctiva without redness, discharge or icterus. Neck/lymp/endocrine: Supple,no lymphadenopathy, no thyromegaly CV: RRR no murmur, no edema, +2/4 P posterior tibialis pulses. no carotid bruits. No JVD. Chest: CTAB, no wheeze, rhonchi or crackles. Normal  Respiratory effort. good Air movement. Abd: Soft. thin. NTND. BS present. no Masses palpated. No hepatosplenomegaly. No rebound tenderness or guarding. Skin: no rashes, purpura or petechiae. Warm and well-perfused. Skin intact. Neuro/Msk:  Normal gait. PERLA. EOMi. Alert. Oriented x3.  Cranial nerves II through XII intact. Muscle strength 5/5 upper/lower extremity. DTRs equal bilaterally. Psych: Normal affect, dress and demeanor. Normal speech. Normal thought content and judgment.  Assessment/plan: Carla Watts is a 50 y.o. female present for CPE.  Encounter for preventative adult health care examination Patient was encouraged to exercise greater than 150 minutes a week. Patient was encouraged to choose a diet filled with fresh fruits and vegetables, and lean meats. AVS provided to patient today for education/recommendation on gender specific health and safety maintenance. Colonoscopy: No fhx, routine screening to start at 50y Mammogram: Last mammogram 12/2014, normal after dedicated image in right breast. recs to rpt SCREENING mammogram in 1 year. Order placed today. Completed at breast center.  Cervical cancer screening: Last Pap smear 11/2014, PCP, normal with negative HPV. Rpt 3 years. Does not do SBE routinely.  Immunizations: tdap 01/2016, flu shot 01/2016 Infectious disease screening: HIV completed with last pregnancy, negative. DEXA: N/A Vitamin D deficiency - Vitamin D (25 hydroxy) Peroneal tendonitis of left lower extremity - take mobic daily with food. This is an injury evaluated by another provider. Still with  intermittent pain. She is a Art gallery manager and could benefit sports med referral to take a closer look with [place] over the area and give recs.  - Ambulatory referral to Sports Medicine Thyroid disorder screen - TSH - Comp Met (CMET) Screening for deficiency anemia - CBC w/Diff  Diabetes mellitus screening - HgB A1c Screening cholesterol level - Lipid panel Encounter for screening mammogram for breast cancer - MM DIGITAL SCREENING BILATERAL; Future  - discussion with patient surrounding her screening concerns for ovarian cancer and cerebral aneurysm.  Ovarian cancer in her pat aunt, no other concerning/related cancers in the family. Discussed with her it not reccommended to screen, unless genetic marker known in family, multiple family members with ovarian cancer/breast cancer/prostate etc, first degree relative affected. Preventive measures/routine exams and monitor for any symptoms are key. If she finds out there are other cancers in her family that could change this recommendation, she can call in and I would be happy to refer her to genetic counseling to discuss in detail. As far as the cerebral aneurysm in her aunt, recs  are to not screen unless 2 affected family members and/or first degree relative.    Return in about 1 year (around 01/11/2017) for CPE.  Electronically signed by: Howard Pouch, DO [place]

## 2016-01-12 NOTE — Patient Instructions (Signed)
Health Maintenance, Female Adopting a healthy lifestyle and getting preventive care can go a long way to promote health and wellness. Talk with your health care provider about what schedule of regular examinations is right for you. This is a good chance for you to check in with your provider about disease prevention and staying healthy. In between checkups, there are plenty of things you can do on your own. Experts have done a lot of research about which lifestyle changes and preventive measures are most likely to keep you healthy. Ask your health care provider for more information. WEIGHT AND DIET  Eat a healthy diet  Be sure to include plenty of vegetables, fruits, low-fat dairy products, and lean protein.  Do not eat a lot of foods high in solid fats, added sugars, or salt.  Get regular exercise. This is one of the most important things you can do for your health.  Most adults should exercise for at least 150 minutes each week. The exercise should increase your heart rate and make you sweat (moderate-intensity exercise).  Most adults should also do strengthening exercises at least twice a week. This is in addition to the moderate-intensity exercise.  Maintain a healthy weight  Body mass index (BMI) is a measurement that can be used to identify possible weight problems. It estimates body fat based on height and weight. Your health care provider can help determine your BMI and help you achieve or maintain a healthy weight.  For females 20 years of age and older:   A BMI below 18.5 is considered underweight.  A BMI of 18.5 to 24.9 is normal.  A BMI of 25 to 29.9 is considered overweight.  A BMI of 30 and above is considered obese.  Watch levels of cholesterol and blood lipids  You should start having your blood tested for lipids and cholesterol at 49 years of age, then have this test every 5 years.  You may need to have your cholesterol levels checked more often if:  Your lipid  or cholesterol levels are high.  You are older than 50 years of age.  You are at high risk for heart disease.  CANCER SCREENING   Lung Cancer  Lung cancer screening is recommended for adults 55-80 years old who are at high risk for lung cancer because of a history of smoking.  A yearly low-dose CT scan of the lungs is recommended for people who:  Currently smoke.  Have quit within the past 15 years.  Have at least a 30-pack-year history of smoking. A pack year is smoking an average of one pack of cigarettes a day for 1 year.  Yearly screening should continue until it has been 15 years since you quit.  Yearly screening should stop if you develop a health problem that would prevent you from having lung cancer treatment.  Breast Cancer  Practice breast self-awareness. This means understanding how your breasts normally appear and feel.  It also means doing regular breast self-exams. Let your health care provider know about any changes, no matter how small.  If you are in your 20s or 30s, you should have a clinical breast exam (CBE) by a health care provider every 1-3 years as part of a regular health exam.  If you are 40 or older, have a CBE every year. Also consider having a breast X-ray (mammogram) every year.  If you have a family history of breast cancer, talk to your health care provider about genetic screening.  If you   are at high risk for breast cancer, talk to your health care provider about having an MRI and a mammogram every year.  Breast cancer gene (BRCA) assessment is recommended for women who have family members with BRCA-related cancers. BRCA-related cancers include:  Breast.  Ovarian.  Tubal.  Peritoneal cancers.  Results of the assessment will determine the need for genetic counseling and BRCA1 and BRCA2 testing. Cervical Cancer Your health care provider may recommend that you be screened regularly for cancer of the pelvic organs (ovaries, uterus, and  vagina). This screening involves a pelvic examination, including checking for microscopic changes to the surface of your cervix (Pap test). You may be encouraged to have this screening done every 3 years, beginning at age 21.  For women ages 30-65, health care providers may recommend pelvic exams and Pap testing every 3 years, or they may recommend the Pap and pelvic exam, combined with testing for human papilloma virus (HPV), every 5 years. Some types of HPV increase your risk of cervical cancer. Testing for HPV may also be done on women of any age with unclear Pap test results.  Other health care providers may not recommend any screening for nonpregnant women who are considered low risk for pelvic cancer and who do not have symptoms. Ask your health care provider if a screening pelvic exam is right for you.  If you have had past treatment for cervical cancer or a condition that could lead to cancer, you need Pap tests and screening for cancer for at least 20 years after your treatment. If Pap tests have been discontinued, your risk factors (such as having a new sexual partner) need to be reassessed to determine if screening should resume. Some women have medical problems that increase the chance of getting cervical cancer. In these cases, your health care provider may recommend more frequent screening and Pap tests. Colorectal Cancer  This type of cancer can be detected and often prevented.  Routine colorectal cancer screening usually begins at 50 years of age and continues through 49 years of age.  Your health care provider may recommend screening at an earlier age if you have risk factors for colon cancer.  Your health care provider may also recommend using home test kits to check for hidden blood in the stool.  A small camera at the end of a tube can be used to examine your colon directly (sigmoidoscopy or colonoscopy). This is done to check for the earliest forms of colorectal  cancer.  Routine screening usually begins at age 50.  Direct examination of the colon should be repeated every 5-10 years through 49 years of age. However, you may need to be screened more often if early forms of precancerous polyps or small growths are found. Skin Cancer  Check your skin from head to toe regularly.  Tell your health care provider about any new moles or changes in moles, especially if there is a change in a mole's shape or color.  Also tell your health care provider if you have a mole that is larger than the size of a pencil eraser.  Always use sunscreen. Apply sunscreen liberally and repeatedly throughout the day.  Protect yourself by wearing long sleeves, pants, a wide-brimmed hat, and sunglasses whenever you are outside. HEART DISEASE, DIABETES, AND HIGH BLOOD PRESSURE   High blood pressure causes heart disease and increases the risk of stroke. High blood pressure is more likely to develop in:  People who have blood pressure in the high end   of the normal range (130-139/85-89 mm Hg).  People who are overweight or obese.  People who are African American.  If you are 38-23 years of age, have your blood pressure checked every 3-5 years. If you are 61 years of age or older, have your blood pressure checked every year. You should have your blood pressure measured twice--once when you are at a hospital or clinic, and once when you are not at a hospital or clinic. Record the average of the two measurements. To check your blood pressure when you are not at a hospital or clinic, you can use:  An automated blood pressure machine at a pharmacy.  A home blood pressure monitor.  If you are between 45 years and 39 years old, ask your health care provider if you should take aspirin to prevent strokes.  Have regular diabetes screenings. This involves taking a blood sample to check your fasting blood sugar level.  If you are at a normal weight and have a low risk for diabetes,  have this test once every three years after 49 years of age.  If you are overweight and have a high risk for diabetes, consider being tested at a younger age or more often. PREVENTING INFECTION  Hepatitis B  If you have a higher risk for hepatitis B, you should be screened for this virus. You are considered at high risk for hepatitis B if:  You were born in a country where hepatitis B is common. Ask your health care provider which countries are considered high risk.  Your parents were born in a high-risk country, and you have not been immunized against hepatitis B (hepatitis B vaccine).  You have HIV or AIDS.  You use needles to inject street drugs.  You live with someone who has hepatitis B.  You have had sex with someone who has hepatitis B.  You get hemodialysis treatment.  You take certain medicines for conditions, including cancer, organ transplantation, and autoimmune conditions. Hepatitis C  Blood testing is recommended for:  Everyone born from [birth year] through 1964.  Anyone with known risk factors for hepatitis C. Sexually transmitted infections (STIs)  You should be screened for sexually transmitted infections (STIs) including gonorrhea and chlamydia if:  You are sexually active and are younger than 49 years of age.  You are older than 49 years of age and your health care provider tells you that you are at risk for this type of infection.  Your sexual activity has changed since you were last screened and you are at an increased risk for chlamydia or gonorrhea. Ask your health care provider if you are at risk.  If you do not have HIV, but are at risk, it may be recommended that you take a prescription medicine daily to prevent HIV infection. This is called pre-exposure prophylaxis (PrEP). You are considered at risk if:  You are sexually active and do not regularly use condoms or know the HIV status of your partner(s).  You take drugs by injection.  You are sexually  active with a partner who has HIV. Talk with your health care provider about whether you are at high risk of being infected with HIV. If you choose to begin PrEP, you should first be tested for HIV. You should then be tested every 3 months for as long as you are taking PrEP.  PREGNANCY   If you are premenopausal and you may become pregnant, ask your health care provider about preconception counseling.  If you may  become pregnant, take 400 to 800 micrograms (mcg) of folic acid every day.  If you want to prevent pregnancy, talk to your health care provider about birth control (contraception). OSTEOPOROSIS AND MENOPAUSE   Osteoporosis is a disease in which the bones lose minerals and strength with aging. This can result in serious bone fractures. Your risk for osteoporosis can be identified using a bone density scan.  If you are 61 years of age or older, or if you are at risk for osteoporosis and fractures, ask your health care provider if you should be screened.  Ask your health care provider whether you should take a calcium or vitamin D supplement to lower your risk for osteoporosis.  Menopause may have certain physical symptoms and risks.  Hormone replacement therapy may reduce some of these symptoms and risks. Talk to your health care provider about whether hormone replacement therapy is right for you.  HOME CARE INSTRUCTIONS   Schedule regular health, dental, and eye exams.  Stay current with your immunizations.   Do not use any tobacco products including cigarettes, chewing tobacco, or electronic cigarettes.  If you are pregnant, do not drink alcohol.  If you are breastfeeding, limit how much and how often you drink alcohol.  Limit alcohol intake to no more than 1 drink per day for nonpregnant women. One drink equals 12 ounces of beer, 5 ounces of wine, or 1 ounces of hard liquor.  Do not use street drugs.  Do not share needles.  Ask your health care provider for help if  you need support or information about quitting drugs.  Tell your health care provider if you often feel depressed.  Tell your health care provider if you have ever been abused or do not feel safe at home.   This information is not intended to replace advice given to you by your health care provider. Make sure you discuss any questions you have with your health care provider.   Document Released: 09/03/2010 Document Revised: 03/11/2014 Document Reviewed: 01/20/2013 Elsevier Interactive Patient Education [employer].

## 2016-01-15 ENCOUNTER — Telehealth: Payer: Self-pay | Admitting: Family Medicine

## 2016-01-15 DIAGNOSIS — Z803 Family history of malignant neoplasm of breast: Secondary | ICD-10-CM

## 2016-01-15 DIAGNOSIS — Z8041 Family history of malignant neoplasm of ovary: Secondary | ICD-10-CM

## 2016-01-15 NOTE — Telephone Encounter (Signed)
Please call pt: - her labs are al great, with the exception of her vit d. She should be encouraged to take [phone] u daily with a meal.  - please give the following info to patient. She has concerns over fhx of ovarian and aneurysms in her family. These are the recommendations on each of those conditions and screenings supported by research.   - Aneurysms: recs are to not screen unless 2 affected family members and/or first degree relative.   - Ovarian cancer: reccommended to NOT screen, unless genetic marker positive known in family, multiple family members with ovarian cancer/breast cancer/prostate etc, first degree relative affected. Preventive measures/routine exams and monitor for any symptoms are key. If she finds out there are other cancers in her family that could change this recommendation, she can call in and I would be happy to refer her to genetic counseling to discuss in detail.

## 2016-01-16 NOTE — Telephone Encounter (Signed)
Spoke with patient reviewed information and instructions patient verbalized understanding. Patient states she has stopped taking meloxicam she states it was causing her to have a rash. It has been added to her allergy list and discontinued on her med list. She would like to know if there is anything else she could take instead. Please advise

## 2016-01-16 NOTE — Telephone Encounter (Signed)
She could try OTC aleve once daily or I can call in another antiinflammatory called diclofenac for her to try.

## 2016-01-17 ENCOUNTER — Encounter: Payer: BLUE CROSS/BLUE SHIELD | Admitting: Family Medicine

## 2016-01-17 NOTE — Telephone Encounter (Signed)
Tried to call patient mailbox is full unable to leave a message. 

## 2016-01-18 NOTE — Telephone Encounter (Signed)
Patient returned call to nurse.  However, calls are being routed to [place] due to [facility] out[facility] at [facility].  Please call patient back to provide instructions, tel # [phone].

## 2016-01-19 ENCOUNTER — Encounter: Payer: Self-pay | Admitting: Family Medicine

## 2016-01-19 NOTE — Telephone Encounter (Signed)
As stated at her office visit and phone messages to patient, if she would like to be referred to genetic counseling to discuss the fhx and her need/recommnedations on genetic testing I would be happy to place that for her.  - otherwise she is to have yearly mammogram and preventive PAP exam on schedule.

## 2016-01-19 NOTE — Telephone Encounter (Signed)
Please advise 

## 2016-01-19 NOTE — Telephone Encounter (Signed)
Patient returned call again, to report she never heard back from her original call from earlier this week.  Due to patient's eagerness to get information, I advised the patient of Dr. Alan RipperKuneff's instructions.  Patient voiced her understanding of the instructions and states she will try the OTC Aleve to see if that helps.  She will let us know if it does not.  Patient also wanted to let pcp know that her mother was diagnosed with breast cancer at the end of October.  She is concerned since this is a first degree relative.  Patient wants to know what testing she should have done.

## 2016-01-22 ENCOUNTER — Encounter: Payer: Self-pay | Admitting: Sports Medicine

## 2016-01-22 ENCOUNTER — Ambulatory Visit (INDEPENDENT_AMBULATORY_CARE_PROVIDER_SITE_OTHER): Payer: BLUE CROSS/BLUE SHIELD

## 2016-01-22 ENCOUNTER — Ambulatory Visit (INDEPENDENT_AMBULATORY_CARE_PROVIDER_SITE_OTHER): Payer: BLUE CROSS/BLUE SHIELD | Admitting: Sports Medicine

## 2016-01-22 DIAGNOSIS — G8929 Other chronic pain: Secondary | ICD-10-CM

## 2016-01-22 DIAGNOSIS — R51 Headache: Secondary | ICD-10-CM | POA: Diagnosis not present

## 2016-01-22 DIAGNOSIS — M4312 Spondylolisthesis, cervical region: Secondary | ICD-10-CM

## 2016-01-22 DIAGNOSIS — M47812 Spondylosis without myelopathy or radiculopathy, cervical region: Secondary | ICD-10-CM | POA: Diagnosis not present

## 2016-01-22 DIAGNOSIS — M9901 Segmental and somatic dysfunction of cervical region: Secondary | ICD-10-CM | POA: Diagnosis not present

## 2016-01-22 DIAGNOSIS — M25572 Pain in left ankle and joints of left foot: Secondary | ICD-10-CM

## 2016-01-22 DIAGNOSIS — M5412 Radiculopathy, cervical region: Secondary | ICD-10-CM

## 2016-01-22 DIAGNOSIS — M531 Cervicobrachial syndrome: Secondary | ICD-10-CM | POA: Diagnosis not present

## 2016-01-22 DIAGNOSIS — M9902 Segmental and somatic dysfunction of thoracic region: Secondary | ICD-10-CM | POA: Diagnosis not present

## 2016-01-22 MED ORDER — PREDNISONE 50 MG PO TABS: ORAL_TABLET | ORAL | 0 refills | Status: DC

## 2016-01-22 MED ORDER — DICLOFENAC SODIUM 75 MG PO TBEC: 75.0000 mg | DELAYED_RELEASE_TABLET | Freq: Two times a day (BID) | ORAL | 3 refills | Status: DC

## 2016-01-22 NOTE — Telephone Encounter (Signed)
Referral placed for her 

## 2016-01-22 NOTE — Telephone Encounter (Addendum)
Patient returned call and is wanting referral for genetic testing.  Please place order.

## 2016-01-22 NOTE — Telephone Encounter (Signed)
Left detailed message of recommendations.  Advised pt in message that if she would like referral for genetic testing to give us a call back and if not to just plan on doing yearly mammogram and pap on schedule.  Okay to leave detailed message per DPR.

## 2016-01-22 NOTE — Assessment & Plan Note (Signed)
Left periscapular distribution radiculopathy. Formal physical therapy, 5 days of prednisone, x-rays, MRI, has already failed greater than 6 weeks of physician directed conservative measures including chiropractic manipulation.

## 2016-01-22 NOTE — Progress Notes (Signed)
   Subjective:    I'm seeing this patient as a consultation for:  Dr. Felix Pacinienee Kuneff  CC: Left ankle pain, periscapular pain  HPI: For years this pleasant 50 year old female has had vague pain that she localizes over the anterior aspect of her left ankle, with occasional cracking sensations. She feels this most often during yoga. No swelling, no other mechanical symptoms, no trauma. Pain is localized around the talar dome.  Periscapular pain: Left-sided, also present for several years, has done rehabilitation, as well as chiropractic manipulation for some time without any improvement. Typically has pain radiating around the medial aspect of the left scapula and occasionally down to the fingertips. No bowel or bladder dysfunction, constitutional symptoms, no lower extremity weakness.  Past medical history:  Negative.  See flowsheet/record as well for more information.  Surgical history: Negative.  See flowsheet/record as well for more information.  Family history: Negative.  See flowsheet/record as well for more information.  Social history: Negative.  See flowsheet/record as well for more information.  Allergies, and medications have been entered into the medical record, reviewed, and no changes needed.   Review of Systems: No headache, visual changes, nausea, vomiting, diarrhea, constipation, dizziness, abdominal pain, skin rash, fevers, chills, night sweats, weight loss, swollen lymph nodes, body aches, joint swelling, muscle aches, chest pain, shortness of breath, mood changes, visual or auditory hallucinations.   Objective:   General: Well Developed, well nourished, and in no acute distress.  Neuro/Psych: Alert and oriented x3, extra-ocular muscles intact, able to move all 4 extremities, sensation grossly intact. Skin: Warm and dry, no rashes noted.  Respiratory: Not using accessory muscles, speaking in full sentences, trachea midline.  Cardiovascular: Pulses palpable, no extremity  edema. Abdomen: Does not appear distended. Neck: Negative spurling's Full neck range of motion Grip strength and sensation normal in bilateral hands Strength good C4 to T1 distribution No sensory change to C4 to T1 Reflexes normal Left Ankle: No visible erythema or swelling. Range of motion is full in all directions. Strength is 5/5 in all directions. Stable lateral and medial ligaments; squeeze test and kleiger test unremarkable; Pes cavus, tender to palpation over the tibiotalar joint at the top of the talar dome No pain at base of 5th MT; No tenderness over cuboid; No tenderness over N spot or navicular prominence No tenderness on posterior aspects of lateral and medial malleolus No sign of peroneal tendon subluxations; Negative tarsal tunnel tinel's Able to walk 4 steps.  Cervical spine x-ray show multilevel cervical degenerative disc disease.   Ankle x-ray show an os trigonum but otherwise negative.  Impression and Recommendations:   This case required medical decision making of moderate complexity.  Left ankle pain Unclear etiology, but tender at the tibiotalar joint. Physical therapy, x-rays, diclofenac. Return for custom orthotics.  Left cervical radiculopathy Left periscapular distribution radiculopathy. Formal physical therapy, 5 days of prednisone, x-rays, MRI, has already failed greater than 6 weeks of physician directed conservative measures including chiropractic manipulation.

## 2016-01-22 NOTE — Assessment & Plan Note (Signed)
Unclear etiology, but tender at the tibiotalar joint. Physical therapy, x-rays, diclofenac. Return for custom orthotics.

## 2016-01-22 NOTE — Telephone Encounter (Signed)
Left message with information on patient voice mail 

## 2016-01-22 NOTE — Addendum Note (Signed)
Addended by: Felix PaciniKUNEFF, RENEE A on: 01/22/2016 02:40 PM   Modules accepted: Orders

## 2016-01-29 ENCOUNTER — Ambulatory Visit (INDEPENDENT_AMBULATORY_CARE_PROVIDER_SITE_OTHER): Payer: BLUE CROSS/BLUE SHIELD

## 2016-01-29 DIAGNOSIS — M129 Arthropathy, unspecified: Secondary | ICD-10-CM

## 2016-01-29 DIAGNOSIS — M479 Spondylosis, unspecified: Secondary | ICD-10-CM

## 2016-01-29 DIAGNOSIS — M50223 Other cervical disc displacement at C6-C7 level: Secondary | ICD-10-CM | POA: Diagnosis not present

## 2016-01-29 DIAGNOSIS — M5412 Radiculopathy, cervical region: Secondary | ICD-10-CM

## 2016-02-02 ENCOUNTER — Ambulatory Visit: Payer: BLUE CROSS/BLUE SHIELD | Admitting: Physical Therapy

## 2016-02-02 ENCOUNTER — Ambulatory Visit (INDEPENDENT_AMBULATORY_CARE_PROVIDER_SITE_OTHER): Payer: BLUE CROSS/BLUE SHIELD | Admitting: Sports Medicine

## 2016-02-02 DIAGNOSIS — G8929 Other chronic pain: Secondary | ICD-10-CM | POA: Diagnosis not present

## 2016-02-02 DIAGNOSIS — M25572 Pain in left ankle and joints of left foot: Secondary | ICD-10-CM

## 2016-02-02 DIAGNOSIS — M5412 Radiculopathy, cervical region: Secondary | ICD-10-CM | POA: Diagnosis not present

## 2016-02-02 NOTE — Assessment & Plan Note (Signed)
Custom orthotics as above. 

## 2016-02-02 NOTE — Progress Notes (Signed)

## 2016-02-02 NOTE — Assessment & Plan Note (Signed)
Persistent pain between the shoulder blades, question additional thoracic radicular process. Pain is predominantly beneath the right scapula. We are going to also evaluate the thoracic spine, x-rays, MRI. There are also multilevel cervical disc protrusions

## 2016-02-07 ENCOUNTER — Ambulatory Visit
Admission: RE | Admit: 2016-02-07 | Discharge: 2016-02-07 | Disposition: A | Discharge status: Home / Self Care | Payer: BLUE CROSS/BLUE SHIELD | Source: Ambulatory Visit | Attending: Family Medicine | Admitting: Family Medicine

## 2016-02-07 DIAGNOSIS — Z1231 Encounter for screening mammogram for malignant neoplasm of breast: Secondary | ICD-10-CM | POA: Diagnosis not present

## 2016-02-08 ENCOUNTER — Ambulatory Visit (HOSPITAL_BASED_OUTPATIENT_CLINIC_OR_DEPARTMENT_OTHER)
Admission: RE | Admit: 2016-02-08 | Discharge: 2016-02-08 | Disposition: A | Discharge status: Home / Self Care | Payer: BLUE CROSS/BLUE SHIELD | Source: Ambulatory Visit | Attending: Sports Medicine | Admitting: Sports Medicine

## 2016-02-08 DIAGNOSIS — M4314 Spondylolisthesis, thoracic region: Secondary | ICD-10-CM | POA: Diagnosis not present

## 2016-02-08 DIAGNOSIS — M5412 Radiculopathy, cervical region: Secondary | ICD-10-CM | POA: Diagnosis present

## 2016-02-08 DIAGNOSIS — M5114 Intervertebral disc disorders with radiculopathy, thoracic region: Secondary | ICD-10-CM | POA: Diagnosis not present

## 2016-02-08 DIAGNOSIS — M5124 Other intervertebral disc displacement, thoracic region: Secondary | ICD-10-CM | POA: Diagnosis not present

## 2016-02-08 DIAGNOSIS — M8938 Hypertrophy of bone, other site: Secondary | ICD-10-CM | POA: Diagnosis not present

## 2016-02-14 ENCOUNTER — Encounter: Payer: Self-pay | Admitting: Rehabilitative and Restorative Service Providers"

## 2016-02-14 ENCOUNTER — Ambulatory Visit (INDEPENDENT_AMBULATORY_CARE_PROVIDER_SITE_OTHER): Payer: BLUE CROSS/BLUE SHIELD | Admitting: Rehabilitative and Restorative Service Providers"

## 2016-02-14 DIAGNOSIS — M5412 Radiculopathy, cervical region: Secondary | ICD-10-CM

## 2016-02-14 DIAGNOSIS — R29898 Other symptoms and signs involving the musculoskeletal system: Secondary | ICD-10-CM | POA: Diagnosis not present

## 2016-02-14 NOTE — Patient Instructions (Addendum)
Shoulder Blade Squeeze   Can use swim noodle to assist with position Rotate shoulders back, then squeeze shoulder blades down and back Hold 10 sec Repeat _10___ times. Do _several___ sessions per day.   SUPINE Tips A    Being in the supine position means to be lying on the back. Lying on the back is the position of least compression on the bones and discs of the spine, and helps to re-align the natural curves of the back. Lying on back, arms at side. Work toward 5 min with arms at 90 to 120 degrees.    Scapula Adduction With Pectoralis Stretch: Low - Standing   Shoulders at 45 hands even with shoulders, keeping weight through legs, shift weight forward until you feel pull or stretch through the front of your chest. Hold _30__ seconds. Do _3__ times, _2-4__ times per day.   Scapula Adduction With Pectoralis Stretch: Mid-Range - Standing   Shoulders at 90 elbows even with shoulders, keeping weight through legs, shift weight forward until you feel pull or strength through the front of your chest. Hold __30_ seconds. Do _3__ times, __2-4_ times per day.   Scapula Adduction With Pectoralis Stretch: High - Standing   Shoulders at 120 hands up high on the doorway, keeping weight on feet, shift weight forward until you feel pull or stretch through the front of your chest. Hold _30__ seconds. Do _3__ times, _2-3__ times per day.   TENS UNIT: This is helpful for muscle pain and spasm.   Search and Purchase a TENS 7000 2nd edition at www.tenspros.com. It should be less than $30.     TENS unit instructions: Do not shower or bathe with the unit on Turn the unit off before removing electrodes or batteries If the electrodes lose stickiness add a drop of water to the electrodes after they are disconnected from the unit and place on plastic sheet. If you continued to have difficulty, call the TENS unit company to purchase more electrodes. Do not apply lotion on the skin area prior to  use. Make sure the skin is clean and dry as this will help prolong the life of the electrodes. After use, always check skin for unusual red areas, rash or other skin difficulties. If there are any skin problems, does not apply electrodes to the same area. Never remove the electrodes from the unit by pulling the wires. Do not use the TENS unit or electrodes other than as directed. Do not change electrode placement without consultating your therapist or physician. Keep 2 fingers with between each electrode.   Trigger Point Dry Needling  . What is Trigger Point Dry Needling (DN)? o DN is a physical therapy technique used to treat muscle pain and dysfunction. Specifically, DN helps deactivate muscle trigger points (muscle knots).  o A thin filiform needle is used to penetrate the skin and stimulate the underlying trigger point. The goal is for a local twitch response (LTR) to occur and for the trigger point to relax. No medication of any kind is injected during the procedure.   . What Does Trigger Point Dry Needling Feel Like?  o The procedure feels different for each individual patient. Some patients report that they do not actually feel the needle enter the skin and overall the process is not painful. Very mild bleeding may occur. However, many patients feel a deep cramping in the muscle in which the needle was inserted. This is the local twitch response.   Marland Kitchen. How Will I feel  after the treatment? o Soreness is normal, and the onset of soreness may not occur for a few hours. Typically this soreness does not last longer than two days.  o Bruising is uncommon, however; ice can be used to decrease any possible bruising.  o In rare cases feeling tired or nauseous after the treatment is normal. In addition, your symptoms may get worse before they get better, this period will typically not last longer than 24 hours.   . What Can I do After My Treatment? o Increase your hydration by drinking more water  for the next 24 hours. o You may place ice or heat on the areas treated that have become sore, however, do not use heat on inflamed or bruised areas. Heat often brings more relief post needling. o You can continue your regular activities, but vigorous activity is not recommended initially after the treatment for 24 hours. o DN is best combined with other physical therapy such as strengthening, stretching, and other therapies.

## 2016-02-14 NOTE — Therapy (Signed)
[facility] [address] [place], [place], [address] Phone: [phone]   Fax:  [phone]  Physical Therapy Evaluation  Patient Details  Name: Carla Watts MRN: [id number] Date of Birth: 08/05/1965 Referring Provider: Dr Benjamin Stainhekkekandam  Encounter Date: 02/14/2016      PT End of Session - 02/14/16 1407    Visit Number 1   Number of Visits 12   Date for PT Re-Evaluation 03/27/16   PT Start Time 1414   PT Stop Time 1509   PT Time Calculation (min) 55 min   Activity Tolerance Patient tolerated treatment well      Past Medical History:  Diagnosis Date  . Chicken pox   . GERD (gastroesophageal reflux disease)     Past Surgical History:  Procedure Laterality Date  . CERVICAL BIOPSY  W/ LOOP ELECTRODE EXCISION    . CESAREAN SECTION    . laparoscopy of uterus    . OOPHORECTOMY Right 2002    There were no vitals filed for this visit.       Subjective Assessment - 02/14/16 1423    Subjective Patient reports that she has had Rt midback pain for several years. She was sick for several months, coughing, when she felt her rib move and she has had problems since that time. She has pain with she moves into an inverted postion, suspended from fabric for [employer] silks with exercise. She teaches aerial yoga.    Pertinent History Fx clavical as a child ? rt or lt, displaced rib treated with chiropractic care ~ 4 years ago   How long can you sit comfortably? no limit   How long can you stand comfortably? no limit   How long can you walk comfortably? 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 No/denies   Pain Score 9    Pain Location Thoracic   Pain Orientation Right   Pain Descriptors / Indicators Stabbing   Pain Type Chronic pain   Pain Onset More than a month ago   Pain Frequency Intermittent   Aggravating Factors  aerial  inversion on silk   Pain Relieving Factors avoiding inversion activities; better when she is stronger through her core             [facility] PT Assessment - 02/14/16 0001      Assessment   Medical Diagnosis Cervical radiculopathy   Referring Provider Dr Benjamin Stainhekkekandam   Onset Date/Surgical Date 11/03/11   Hand Dominance Right   Next MD Visit PRN   Prior Therapy chriopractic care ~ 4 yrs ago      Precautions   Precautions None     Balance Screen   Has the patient fallen in the past 6 months No   Has the patient had a decrease in activity level because of a fear of falling?  No   Is the patient reluctant to leave their home because of a fear of falling?  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    Leisure household chores; yoga; Therapist, occupationalaerials     Sensation   Additional Comments WFL's  AROM   Cervical Flexion 55   Cervical Extension 49   Cervical - Right Side Bend 33   Cervical - Left Side Bend 20   Cervical - Right Rotation 65   Cervical - Left Rotation 70     Strength   Overall Strength Comments 5/5 bilat UE's except Rt shd flex, horiz abd 4+/5      Palpation   Spinal mobility mid thoracic tightness with CPA and lateral mobs greatest Rt T4/5/6   Palpation comment muscular tightness through Rt > Lt thoracic paraspinals; Rt lower trap; bilat upper traps; pecs Rt > Lt                    [facility] Adult PT Treatment/Exercise - 02/14/16 0001      Self-Care   Self-Care --  supine on noodle along and across T-spine      Therapeutic Activites    Therapeutic Activities --  myofacial ball release work - thoracic spine      Neuro Re-ed    Neuro Re-ed Details  working on posture and alignment      Shoulder Exercises: Standing   Other Standing Exercises  scap squeeze 10 sec x 10 with noodle      Shoulder Exercises: Stretch   Other Shoulder Stretches 3 way doorway stretcy 30 sec x 3 each position      Moist Heat Therapy   Number Minutes Moist Heat 20 Minutes   Moist Heat Location --  thoracic spine      Electrical Stimulation   Electrical Stimulation Location Rt thoracic paraspinals x 3 Lt x 1    Electrical Stimulation Action IFC   Electrical Stimulation Parameters to tolerance   Electrical Stimulation Goals Pain;Tone                PT Education - 02/14/16 1504    Education provided Yes   Education Details HEP; TDN; TENS info   Person(s) Educated Patient   Methods Explanation;Demonstration;Tactile cues;Verbal cues;Handout   Comprehension Verbalized understanding;Returned demonstration;Verbal cues required;Tactile cues required             PT Long Term Goals - 02/14/16 1409      PT LONG TERM GOAL #1   Title Improve posture and alignment with patient to demonstrate improved upright posture through head, neck and shoulders 03/27/16   Time 6   Period Weeks   Status New     PT LONG TERM GOAL #2   Title Increase cervical ROM, moblity by 5-8 degrees in lateral flexion 03/27/16   Time 6   Period Weeks   Status New     PT LONG TERM GOAL #3   Title Decrease pain in thoracic spine with inversion on silks from 9/10 to 0-3/10 03/27/16   Time 6   Period Weeks   Status New     PT LONG TERM GOAL #4   Title Independent HEP 03/27/16   Time 6   Period Weeks   Status New     PT LONG TERM GOAL #5   Title Improve FOTO to </=  % limitation 03/27/16   Time 6   Period Weeks   Status New               Plan - 02/14/16 1516    Clinical Impression Statement Patient presents with Rt thoracic pain with suspended positions with aerial silk work. She has poor posture and alignment; limited thoracic mobility with tightness noted with CPA mobs through mid  to lower thoracic spine. She has significant muscular tightness through  the pecs; upper traps; leveator; thoracic paraspinals; serratus posterior. She has weakness in Rt shoulder flexion and horizontal abduction; muscular imbalance; pain with functional activities.    Rehab Potential Good   PT Frequency 2x / week   PT Duration 6 weeks   PT Treatment/Interventions Patient/family education;ADLs/Self Care Home Management;Cryotherapy;Electrical Stimulation;Iontophoresis 4mg /ml Dexamethasone;Moist Heat;Ultrasound;Dry needling;Manual techniques;Therapeutic activities;Therapeutic exercise   PT Next Visit Plan TDN Rt thoracic musculature; progress with posterior shoulder girdle strengthening; manual work Therapist, nutritionalinc thoracic mobs; modalities as indicated; ankle evaluation as indicated    Consulted and Agree with Plan of Care Patient      Patient will benefit from skilled therapeutic intervention in order to improve the following deficits and impairments:  Postural dysfunction, Improper body mechanics, Pain, Increased fascial restricitons, Increased muscle spasms, Decreased activity tolerance  Visit Diagnosis: Cervical radiculopathy - Plan: PT plan of care cert/re-cert  Other symptoms and signs involving the musculoskeletal system - Plan: PT plan of care cert/re-cert     Problem List Patient Active Problem List   Diagnosis Date Noted  . Left cervical radiculopathy 01/22/2016  . Left ankle pain 01/22/2016  . Peroneal tendonitis of left lower extremity 07/05/2015  . Vitamin D deficiency 11/25/2014  . Palpitations 11/24/2014    D[place]ane Timson Rober MinionP Maliq Pilley PT, MPH  02/14/2016, 4:58 PM  [facility] [address] [place], [place], [address] Phone: [phone]   Fax:  [phone]  Name: Carla Watts MRN: [id number] Date of Birth: 09/16/1965

## 2016-02-16 DIAGNOSIS — M531 Cervicobrachial syndrome: Secondary | ICD-10-CM | POA: Diagnosis not present

## 2016-02-16 DIAGNOSIS — M9902 Segmental and somatic dysfunction of thoracic region: Secondary | ICD-10-CM | POA: Diagnosis not present

## 2016-02-16 DIAGNOSIS — M791 Myalgia: Secondary | ICD-10-CM | POA: Diagnosis not present

## 2016-02-16 DIAGNOSIS — M9901 Segmental and somatic dysfunction of cervical region: Secondary | ICD-10-CM | POA: Diagnosis not present

## 2016-02-21 ENCOUNTER — Encounter: Payer: Self-pay | Admitting: Rehabilitative and Restorative Service Providers"

## 2016-02-21 ENCOUNTER — Ambulatory Visit (INDEPENDENT_AMBULATORY_CARE_PROVIDER_SITE_OTHER): Payer: BLUE CROSS/BLUE SHIELD | Admitting: Rehabilitative and Restorative Service Providers"

## 2016-02-21 DIAGNOSIS — R29898 Other symptoms and signs involving the musculoskeletal system: Secondary | ICD-10-CM | POA: Diagnosis not present

## 2016-02-21 DIAGNOSIS — M5412 Radiculopathy, cervical region: Secondary | ICD-10-CM

## 2016-02-21 NOTE — Therapy (Signed)
[facility] [address] [place], [place], [address] Phone: [phone]   Fax:  [phone]  Physical Therapy Treatment  Patient Details  Name: Carla Watts MRN: [id number] Date of Birth: 04/29/1965 Referring Provider: Dr Benjamin Stainhekkekandam  Encounter Date: 02/21/2016      PT End of Session - 02/21/16 0728    Visit Number 2   Number of Visits 12   Date for PT Re-Evaluation 03/27/16   PT Start Time 0718   PT Stop Time 0807   PT Time Calculation (min) 49 min   Activity Tolerance Patient tolerated treatment well      Past Medical History:  Diagnosis Date  . Chicken pox   . GERD (gastroesophageal reflux disease)     Past Surgical History:  Procedure Laterality Date  . CERVICAL BIOPSY  W/ LOOP ELECTRODE EXCISION    . CESAREAN SECTION    . laparoscopy of uterus    . OOPHORECTOMY Right 2002    There were no vitals filed for this visit.      Subjective Assessment - 02/21/16 0724    Subjective Patient reports that symptoms are unchanged. She has pain only with certain activities.    Currently in Pain? No/denies                         [facility] - 02/21/16 0001      Shoulder Exercises: Prone   Other Prone Exercises prone scap squeeze 5 sec x 10; W's 5 sec x 10      Shoulder Exercises: Stretch   Other Shoulder Stretches 3 way doorway stretcy 30 sec x 3 each position      Moist Heat Therapy   Number Minutes Moist Heat 15 Minutes   Moist Heat Location --  thoracic spine      Electrical Stimulation   Electrical Stimulation Location Rt thoracic paraspinals x 3 Lt x 1    Electrical Stimulation Action IFC   Electrical Stimulation Parameters to tolerance   Electrical Stimulation Goals Pain;Tone     Manual Therapy   Manual therapy comments pt prone   Soft tissue mobilization thoracic paraspinals; lower/mid traps; Rt > Lt    Myofascial Release Rt posterior thoracic            Trigger Point Dry Needling - 02/21/16 0755    Consent Given? Yes   Education Handout Provided Yes   Muscles Treated Upper Body Longissimus  lower trap thoracic paraspinals Rt - decreased tightness               PT Education - 02/21/16 0751    Education provided Yes   Education Details HEP TDN   Person(s) Educated Patient   Methods Explanation;Demonstration;Tactile cues;Verbal cues;Handout   Comprehension Verbalized understanding;Returned demonstration;Verbal cues required;Tactile cues required             PT Long Term Goals - 02/21/16 0758      PT LONG TERM GOAL #1   Title Improve posture and alignment with patient to demonstrate improved upright posture through head, neck and shoulders 03/27/16   Time 6   Period Weeks   Status On-going     PT LONG TERM GOAL #2   Title Increase cervical ROM, moblity by 5-8 degrees in lateral flexion 03/27/16   Time 6   Period Weeks   Status On-going     PT LONG TERM GOAL #3   Title Decrease pain in thoracic spine  with inversion on silks from 9/10 to 0-3/10 03/27/16   Time 6   Period Weeks   Status On-going     PT LONG TERM GOAL #4   Title Independent HEP 03/27/16   Time 6   Period Weeks   Status On-going     PT LONG TERM GOAL #5   Title Improve FOTO to </=  % limitation 03/27/16   Time 6   Period Weeks   Status On-going               Plan - 02/21/16 0756    Clinical Impression Statement No change in symptoms reported. Continued palpable tightness Rt > Lt mid to lower thoracic spine - paraspinals and lower trap. Good response to TDN with decreased muscular tightness to palpatioin noted following treatment.    Rehab Potential Good   PT Frequency 2x / week   PT Duration 6 weeks   PT Treatment/Interventions Patient/family education;ADLs/Self Care Home Management;Cryotherapy;Electrical Stimulation;Iontophoresis 4mg /ml Dexamethasone;Moist Heat;Ultrasound;Dry needling;Manual techniques;Therapeutic  activities;Therapeutic exercise   PT Next Visit Plan assess response to TDN Rt thoracic musculature; progress with posterior shoulder girdle strengthening; manual work Baristainc thoracic mobs; modalities as indicated; ankle evaluation as indicated    Consulted and Agree with Plan of Care Patient      Patient will benefit from skilled therapeutic intervention in order to improve the following deficits and impairments:  Postural dysfunction, Improper body mechanics, Pain, Increased fascial restricitons, Increased muscle spasms, Decreased activity tolerance  Visit Diagnosis: Cervical radiculopathy  Other symptoms and signs involving the musculoskeletal system     Problem List Patient Active Problem List   Diagnosis Date Noted  . Left cervical radiculopathy 01/22/2016  . Left ankle pain 01/22/2016  . Peroneal tendonitis of left lower extremity 07/05/2015  . Vitamin D deficiency 11/25/2014  . Palpitations 11/24/2014    Yuma Blucher Rober MinionP Allona Gondek PT, MPH  02/21/2016, 8:00 AM  [facility] [address] [place], [place], [address] Phone: [phone]   Fax:  [phone]  Name: Carla Watts MRN: [id number] Date of Birth: 09/03/1965

## 2016-02-21 NOTE — Patient Instructions (Addendum)
Shoulder Blade Squeeze: Arms at Sides    Arms at sides, parallel, elbows straight, palms up. Press pelvis down. Squeeze backbone with shoulder blades, raising front of shoulders, chest, and arms. Keep head and neck neutral. Hold _5__ seconds. Relax. Repeat _10__ times.  Shoulder Blade Squeeze: W    Arms out to sides at 90 palms down. Bend elbows to 90. Press pelvis down. Squeeze backbone with shoulder blades. Raise arms, front of shoulders, chest, and head. Keep neck neutral. Hold _5__ seconds. Relax. Repeat _10__ times.  Trigger Point Dry Needling  . What is Trigger Point Dry Needling (DN)? o DN is a physical therapy technique used to treat muscle pain and dysfunction. Specifically, DN helps deactivate muscle trigger points (muscle knots).  o A thin filiform needle is used to penetrate the skin and stimulate the underlying trigger point. The goal is for a local twitch response (LTR) to occur and for the trigger point to relax. No medication of any kind is injected during the procedure.   . What Does Trigger Point Dry Needling Feel Like?  o The procedure feels different for each individual patient. Some patients report that they do not actually feel the needle enter the skin and overall the process is not painful. Very mild bleeding may occur. However, many patients feel a deep cramping in the muscle in which the needle was inserted. This is the local twitch response.   Marland Kitchen. How Will I feel after the treatment? o Soreness is normal, and the onset of soreness may not occur for a few hours. Typically this soreness does not last longer than two days.  o Bruising is uncommon, however; ice can be used to decrease any possible bruising.  o In rare cases feeling tired or nauseous after the treatment is normal. In addition, your symptoms may get worse before they get better, this period will typically not last longer than 24 hours.   . What Can I do After My Treatment? o Increase your hydration  by drinking more water for the next 24 hours. o You may place ice or heat on the areas treated that have become sore, however, do not use heat on inflamed or bruised areas. Heat often brings more relief post needling. o You can continue your regular activities, but vigorous activity is not recommended initially after the treatment for 24 hours. o DN is best combined with other physical therapy such as strengthening, stretching, and other therapies.

## 2016-02-23 ENCOUNTER — Encounter: Payer: Self-pay | Admitting: Physical Therapy

## 2016-02-23 ENCOUNTER — Ambulatory Visit (INDEPENDENT_AMBULATORY_CARE_PROVIDER_SITE_OTHER): Payer: BLUE CROSS/BLUE SHIELD | Admitting: Physical Therapy

## 2016-02-23 DIAGNOSIS — R29898 Other symptoms and signs involving the musculoskeletal system: Secondary | ICD-10-CM | POA: Diagnosis not present

## 2016-02-23 DIAGNOSIS — M5412 Radiculopathy, cervical region: Secondary | ICD-10-CM

## 2016-02-23 NOTE — Therapy (Signed)
[facility] [address] [place], [place], [address] Phone: [phone]   Fax:  [phone]  Physical Therapy Treatment  Patient Details  Name: Carla Watts MRN: [id number] Date of Birth: 08/25/1965 Referring Provider: Dr Benjamin Stainhekkekandam  Encounter Date: 02/23/2016      PT End of Session - 02/23/16 1232    Visit Number 3   Number of Visits 12   Date for PT Re-Evaluation 03/27/16   PT Start Time 1155   PT Stop Time 1240   PT Time Calculation (min) 45 min   Activity Tolerance Patient tolerated treatment well   Behavior During Therapy [facility] for tasks assessed/performed      Past Medical History:  Diagnosis Date  . Chicken pox   . GERD (gastroesophageal reflux disease)     Past Surgical History:  Procedure Laterality Date  . CERVICAL BIOPSY  W/ LOOP ELECTRODE EXCISION    . CESAREAN SECTION    . laparoscopy of uterus    . OOPHORECTOMY Right 2002    There were no vitals filed for this visit.      Subjective Assessment - 02/23/16 1229    Subjective Pt reporting today that she feels like she has had some improvements after dry needling last session. Pt reporting things that would increase her pain before were more tolerable.    Pertinent History Fx clavical as a child ? rt or lt, displaced rib treated with chiropractic care ~ 4 years ago   How long can you sit comfortably? no limit   How long can you stand comfortably? no limit   How long can you walk comfortably? 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 Yes   Pain Score 2    Pain Descriptors / Indicators Sore   Pain Type Chronic pain   Pain Onset More than a month ago   Pain Frequency Intermittent   Aggravating Factors  aerial yoga inverstion    Pain Relieving Factors avoiding inversion activities, resting                         [facility] Adult PT  Treatment/Exercise - 02/23/16 0001      Shoulder Exercises: Prone   Other Prone Exercises prone scap squeeze 5 sec x 10; W's 5 sec x 10      Moist Heat Therapy   Number Minutes Moist Heat 15 Minutes   Moist Heat Location --  thoracic spine      Electrical Stimulation   Electrical Stimulation Location Rt thoracic paraspinals x 3 Lt x 1    Electrical Stimulation Action IFC   Electrical Stimulation Parameters to tolerance   Electrical Stimulation Goals Tone;Pain     Manual Therapy   Manual therapy comments sidelying   Soft tissue mobilization scapula mobs, soft tissu mobilization   Myofascial Release Rt posterior thoracic                 PT Education - 02/23/16 1231    Education provided Yes   Education Details HEP verbal review   Methods Explanation   Comprehension Verbalized understanding             PT Long Term Goals - 02/23/16 1236      PT LONG TERM GOAL #1   Title Improve posture and alignment with patient to  demonstrate improved upright posture through head, neck and shoulders 03/27/16   Time 6   Period Weeks   Status On-going     PT LONG TERM GOAL #2   Title Increase cervical ROM, moblity by 5-8 degrees in lateral flexion 03/27/16   Time 6   Period Weeks   Status On-going     PT LONG TERM GOAL #3   Title Decrease pain in thoracic spine with inversion on silks from 9/10 to 0-3/10 03/27/16   Time 6   Period Weeks   Status On-going     PT LONG TERM GOAL #4   Title Independent HEP 03/27/16   Time 6   Period Weeks   Status On-going     PT LONG TERM GOAL #5   Title Improve FOTO to </=  % limitation 03/27/16   Time 6   Period Weeks   Status On-going               Plan - 02/23/16 1232    Clinical Impression Statement Pt arriving to therapy complaining of 2/10 pain in thoracic paraspinals on the R side and medial scapula border. pt tolerated treatment well. Pt also reporting decreased pain with activities at home. Recommending Dry Needling  at next visit. Pt still with muscle tightness in mid to lower traps. Skilled PT needed to address pt's below impairments.    Rehab Potential Good   PT Frequency 2x / week   PT Duration 6 weeks   PT Treatment/Interventions Patient/family education;ADLs/Self Care Home Management;Cryotherapy;Electrical Stimulation;Iontophoresis 4mg /ml Dexamethasone;Moist Heat;Ultrasound;Dry needling;Manual techniques;Therapeutic activities;Therapeutic exercise   PT Next Visit Plan Dry Needling, progress with posterior shoulder girdle strengthening; manual work Baristainc thoracic mobs; modalities as indicated; ankle evaluation as indicated    Consulted and Agree with Plan of Care Patient      Patient will benefit from skilled therapeutic intervention in order to improve the following deficits and impairments:  Postural dysfunction, Improper body mechanics, Pain, Increased fascial restricitons, Increased muscle spasms, Decreased activity tolerance  Visit Diagnosis: Cervical radiculopathy  Other symptoms and signs involving the musculoskeletal system     Problem List Patient Active Problem List   Diagnosis Date Noted  . Left cervical radiculopathy 01/22/2016  . Left ankle pain 01/22/2016  . Peroneal tendonitis of left lower extremity 07/05/2015  . Vitamin D deficiency 11/25/2014  . Palpitations 11/24/2014    Sharmon LeydenJennifer R Abdifatah Colquhoun, MPT  02/23/2016, 12:37 PM  [facility] [address] [place], [place], [address] Phone: [phone]   Fax:  [phone]  Name: Carla Watts MRN: [id number] Date of Birth: 10/12/1965

## 2016-02-28 ENCOUNTER — Encounter: Payer: BLUE CROSS/BLUE SHIELD | Admitting: Rehabilitative and Restorative Service Providers"

## 2016-03-11 ENCOUNTER — Encounter: Payer: BLUE CROSS/BLUE SHIELD | Admitting: Rehabilitative and Restorative Service Providers"

## 2016-03-13 ENCOUNTER — Ambulatory Visit (INDEPENDENT_AMBULATORY_CARE_PROVIDER_SITE_OTHER): Payer: BLUE CROSS/BLUE SHIELD | Admitting: Rehabilitative and Restorative Service Providers"

## 2016-03-13 ENCOUNTER — Encounter: Payer: Self-pay | Admitting: Rehabilitative and Restorative Service Providers"

## 2016-03-13 ENCOUNTER — Telehealth: Payer: Self-pay | Admitting: Genetic Counselor

## 2016-03-13 DIAGNOSIS — M5412 Radiculopathy, cervical region: Secondary | ICD-10-CM

## 2016-03-13 DIAGNOSIS — R29898 Other symptoms and signs involving the musculoskeletal system: Secondary | ICD-10-CM

## 2016-03-13 NOTE — Telephone Encounter (Signed)
PATIENT CALLED TO R/S 1/15 GENETICS APPOINTMENT. PER PATIENT SHE NEEDS A Monday OR Wednesday AT 2 PM. PATIENT GIVEN NEXT AVAILABLE APPOINTMENT WITH IN THIS GUIDELINE FOR Monday 3/19 @ 2 PM.

## 2016-03-13 NOTE — Therapy (Signed)
[place], [name], [address] Phone: [phone]   Fax:  [phone]  Physical Therapy Treatment  Patient Details  Name: Carla Watts MRN: [id number] Date of Birth: [birth year] Referring Provider: Dr Dianah Field  Encounter Date: 03/13/2016      PT End of Session - 03/13/16 1404    Visit Number 4   Number of Visits 12   Date for PT Re-Evaluation 03/27/16   PT Start Time 0277   PT Stop Time 4128   PT Time Calculation (min) 49 min   Activity Tolerance Patient tolerated treatment well      Past Medical History:  Diagnosis Date  . Chicken pox   . GERD (gastroesophageal reflux disease)     Past Surgical History:  Procedure Laterality Date  . CERVICAL BIOPSY  W/ LOOP ELECTRODE EXCISION    . CESAREAN SECTION    . laparoscopy of uterus    . OOPHORECTOMY Right 2002    There were no vitals filed for this visit.      Subjective Assessment - 03/13/16 1406    Subjective Still sore and symptomatic when she works on her silks. The soreness and discomfort is less intense. Wants to try the dry needling again.    Currently in Pain? No/denies   Pain Score 0-No pain   Pain Location Thoracic   Pain Orientation Right   Pain Descriptors / Indicators Sore   Pain Type Chronic pain   Pain Onset More than a month ago   Pain Frequency Intermittent   Aggravating Factors  aerial yoga inversion    Pain Relieving Factors avoiding activities; rest            [facility] PT Assessment - 03/13/16 0001      Assessment   Medical Diagnosis Cervical radiculopathy   Referring Provider Dr Dianah Field   Onset Date/Surgical Date 11/03/11   Hand Dominance Right   Next MD Visit PRN   Prior Therapy chriopractic care ~ 4 yrs ago      AROM   Cervical Flexion 62   Cervical Extension 50   Cervical - Right Side Bend 35   Cervical - Left Side Bend 34   Cervical - Right Rotation 72   Cervical - Left Rotation 75      Strength   Overall Strength Comments 5/5 bilat UE's except Rt shd flex, horiz abd 4+/5      Palpation   Spinal mobility mid thoracic tightness with CPA and lateral mobs greatest Rt T4/5/6   Palpation comment muscular tightness through Rt > Lt thoracic paraspinals; Rt lower trap; bilat upper traps; pecs Rt > Lt                      [facility] Adult PT Treatment/Exercise - 03/13/16 0001      Moist Heat Therapy   Number Minutes Moist Heat 20 Minutes   Moist Heat Location --  thoracic spine / traps      Electrical Stimulation   Electrical Stimulation Location Rt thoracic parascapular musculature    Electrical Stimulation Action IFC   Electrical Stimulation Parameters to tolerance   Electrical Stimulation Goals Tone;Pain     Manual Therapy   Manual therapy comments pt prone   Soft tissue mobilization Rt thoracic and periscapular musculature    Myofascial Release Rt posterior thoracic           Trigger Point Dry Needling - 03/13/16 1432    Consent Given? Yes  Muscles Treated Upper Body Upper trapezius;Levator scapulae;Rhomboids;Supraspinatus;Infraspinatus;Subscapularis;Longissimus   Upper Trapezius Response Palpable increased muscle length;Twitch reponse elicited   Levator Scapulae Response Palpable increased muscle length;Twitch response elicited   Rhomboids Response Palpable increased muscle length;Twitch response elicited   Supraspinatus Response Palpable increased muscle length;Twitch response elicited   Subscapularis Response Palpable increased muscle length;Twitch response elicited   Longissimus Response Palpable increased muscle length;Twitch response elicited  thoracic                    PT Long Term Goals - 03/13/16 1411      PT LONG TERM GOAL #1   Title Improve posture and alignment with patient to demonstrate improved upright posture through head, neck and shoulders 03/27/16   Time 6   Period Weeks   Status On-going     PT LONG TERM GOAL  #2   Title Increase cervical ROM, moblity by 5-8 degrees in lateral flexion 03/27/16   Time 6   Period Weeks   Status Partially Met     PT LONG TERM GOAL #3   Title Decrease pain in thoracic spine with inversion on silks from 9/10 to 0-3/10 03/27/16   Time 6   Period Weeks   Status On-going     PT LONG TERM GOAL #4   Title Independent HEP 03/27/16   Time 6   Period Weeks   Status On-going     PT LONG TERM GOAL #5   Title Improve FOTO to </=  % limitation 03/27/16   Time 6   Period Weeks   Status On-going               Plan - 03/13/16 1433    Clinical Impression Statement Improving cervical mobility and ROM. Patient reports less pain in the Rt shoulder area when she is inverted on her silks with aerial yoga. Progressing gradually toward goals of therapy. Dry needling; manual work and modalities only today. Will work on [place] to address scapular stability at next visit.    Rehab Potential Good   PT Frequency 2x / week   PT Duration 6 weeks   PT Treatment/Interventions Patient/family education;ADLs/Self Care Home Management;Cryotherapy;Electrical Stimulation;Iontophoresis 76m/ml Dexamethasone;Moist Heat;Ultrasound;Dry needling;Manual techniques;Therapeutic activities;Therapeutic exercise   PT Next Visit Plan Dry Needling as indicated, progress with posterior shoulder girdle strengthening; manual work iImmunologistmobs; modalities as indicated; ankle evaluation as indicated    Consulted and Agree with Plan of Care Patient      Patient will benefit from skilled therapeutic intervention in order to improve the following deficits and impairments:  Postural dysfunction, Improper body mechanics, Pain, Increased fascial restricitons, Increased muscle spasms, Decreased activity tolerance  Visit Diagnosis: Cervical radiculopathy  Other symptoms and signs involving the musculoskeletal system     Problem List Patient Active Problem List   Diagnosis Date Noted  . Left  cervical radiculopathy 01/22/2016  . Left ankle pain 01/22/2016  . Peroneal tendonitis of left lower extremity 07/05/2015  . Vitamin D deficiency 11/25/2014  . Palpitations 11/24/2014    Rogerick Baldwin PNilda SimmerPT, MPH 03/13/2016, 2:36 PM  [facility] [address]Phone: [phone]  Fax:  [phone] Name: CBarb ShearMRN: [id number] of Birth: [birth year]

## 2016-03-18 ENCOUNTER — Encounter: Payer: BLUE CROSS/BLUE SHIELD | Admitting: Genetic Counselor

## 2016-03-18 ENCOUNTER — Other Ambulatory Visit: Payer: BLUE CROSS/BLUE SHIELD

## 2016-03-22 ENCOUNTER — Ambulatory Visit (INDEPENDENT_AMBULATORY_CARE_PROVIDER_SITE_OTHER): Payer: BLUE CROSS/BLUE SHIELD | Admitting: Rehabilitative and Restorative Service Providers"

## 2016-03-22 ENCOUNTER — Encounter: Payer: Self-pay | Admitting: Rehabilitative and Restorative Service Providers"

## 2016-03-22 DIAGNOSIS — R29898 Other symptoms and signs involving the musculoskeletal system: Secondary | ICD-10-CM

## 2016-03-22 DIAGNOSIS — M5412 Radiculopathy, cervical region: Secondary | ICD-10-CM | POA: Diagnosis not present

## 2016-03-22 NOTE — Patient Instructions (Signed)
Push up blocks - careful with position of shoulders and especially shoulder blades   Pull down with black theraband Slow and controlled   Thoracic rotation lying on side    (provided stick figures for exercises)

## 2016-03-22 NOTE — Therapy (Signed)
[place], [name], [address] Phone: [phone]   Fax:  [phone]  Physical Therapy Treatment  Patient Details  Name: Carla Watts MRN: [id number] Date of Birth: February 20, 1966 Referring Provider: Dr Dianah Field  Encounter Date: 03/22/2016      PT End of Session - 03/22/16 1451    Visit Number 5   Number of Visits 12   Date for PT Re-Evaluation 03/27/16   PT Start Time 1287   PT Stop Time 1534   PT Time Calculation (min) 47 min   Activity Tolerance Patient tolerated treatment well      Past Medical History:  Diagnosis Date  . Chicken pox   . GERD (gastroesophageal reflux disease)     Past Surgical History:  Procedure Laterality Date  . CERVICAL BIOPSY  W/ LOOP ELECTRODE EXCISION    . CESAREAN SECTION    . laparoscopy of uterus    . OOPHORECTOMY Right 2002    There were no vitals filed for this visit.      Subjective Assessment - 03/22/16 1452    Subjective Does about 4-6 inversions with areial yoga. Pain is greatest with going into and coming out of the inversions. Pain is a little more than it was but not as much as before beginning therapy. Did some exercise at home but not like she was.    Currently in Pain? No/denies            [facility] PT Assessment - 03/22/16 0001      Assessment   Medical Diagnosis Cervical radiculopathy   Referring Provider Dr Dianah Field   Onset Date/Surgical Date 11/03/11   Hand Dominance Right   Next MD Visit PRN   Prior Therapy chriopractic care ~ 4 yrs ago      AROM   Cervical Flexion 63   Cervical Extension 50   Cervical - Right Side Bend 35   Cervical - Left Side Bend 35   Cervical - Right Rotation 72   Cervical - Left Rotation 74     Strength   Overall Strength Comments 5/5 bilat UE's except Rt shd flex, horiz abd 5-/5     Palpation   Spinal mobility mid thoracic tightness with CPA and lateral mobs greatest Rt T4/5/6   Palpation  comment muscular tightness through Rt > Lt thoracic paraspinals; Rt lower trap; bilat upper traps; pecs Rt > Lt                      [facility] Adult PT Treatment/Exercise - 03/22/16 0001      Shoulder Exercises: Seated   Other Seated Exercises push up blocks sustained and hold x 5-8      Shoulder Exercises: Sidelying   Other Sidelying Exercises thoracic rotation (open book with yoga stretch) green TB x 10      Shoulder Exercises: Standing   Other Standing Exercises scapular depression with black TB slow with eccentric control x5-8      Moist Heat Therapy   Number Minutes Moist Heat 20 Minutes   Moist Heat Location --  thoracic spine / traps      Electrical Stimulation   Electrical Stimulation Location Rt thoracic parascapular musculature    Electrical Stimulation Action IFC   Electrical Stimulation Parameters to tolerance   Electrical Stimulation Goals Tone;Pain                PT Education - 03/22/16 1528    Education provided Yes  Education Details HEP   Person(s) Educated Patient   Methods Explanation;Demonstration;Tactile cues;Verbal cues;Handout   Comprehension Verbalized understanding;Returned demonstration;Verbal cues required;Tactile cues required             PT Long Term Goals - 03/13/16 1411      PT LONG TERM GOAL #1   Title Improve posture and alignment with patient to demonstrate improved upright posture through head, neck and shoulders 03/27/16   Time 6   Period Weeks   Status On-going     PT LONG TERM GOAL #2   Title Increase cervical ROM, moblity by 5-8 degrees in lateral flexion 03/27/16   Time 6   Period Weeks   Status Partially Met     PT LONG TERM GOAL #3   Title Decrease pain in thoracic spine with inversion on silks from 9/10 to 0-3/10 03/27/16   Time 6   Period Weeks   Status On-going     PT LONG TERM GOAL #4   Title Independent HEP 03/27/16   Time 6   Period Weeks   Status On-going     PT LONG TERM GOAL #5   Title  Improve FOTO to </=  % limitation 03/27/16   Time 6   Period Weeks   Status On-going               Plan - 03/22/16 1529    Clinical Impression Statement continued pain with higer level activity - specifically with inversion and coming out of inversion on silks. Worked today to add higher level thoracic exercises. Patient is somewhat inconsistent with TB posterior shoulder girdle strengthening which she needs to work on to progress to higher level activity. Tolerated new exercise with report of fatigue/work through posterior shoudler girdle area wit hsome of the exercises. progressing gradually with rehab toward goals.    Rehab Potential Good   PT Frequency 2x / week   PT Duration 6 weeks   PT Treatment/Interventions Patient/family education;ADLs/Self Care Home Management;Cryotherapy;Electrical Stimulation;Iontophoresis '4mg'$ /ml Dexamethasone;Moist Heat;Ultrasound;Dry needling;Manual techniques;Therapeutic activities;Therapeutic exercise   PT Next Visit Plan Dry Needling as indicated, progress with posterior shoulder girdle strengthening; manual work Immunologist mobs; modalities as indicated; ankle evaluation as indicated    Consulted and Agree with Plan of Care Patient      Patient will benefit from skilled therapeutic intervention in order to improve the following deficits and impairments:  Postural dysfunction, Improper body mechanics, Pain, Increased fascial restricitons, Increased muscle spasms, Decreased activity tolerance  Visit Diagnosis: Cervical radiculopathy  Other symptoms and signs involving the musculoskeletal system     Problem List Patient Active Problem List   Diagnosis Date Noted  . Left cervical radiculopathy 01/22/2016  . Left ankle pain 01/22/2016  . Peroneal tendonitis of left lower extremity 07/05/2015  . Vitamin D deficiency 11/25/2014  . Palpitations 11/24/2014    Carla Watts Carla Watts PT, MPH  03/22/2016, 3:31 PM  [facility] [place], [name], [address] Phone: [phone]   Fax:  [phone]  Name: Carla Watts MRN: [id number] Date of Birth: 11-Apr-1965

## 2016-03-29 ENCOUNTER — Ambulatory Visit (INDEPENDENT_AMBULATORY_CARE_PROVIDER_SITE_OTHER): Payer: BLUE CROSS/BLUE SHIELD | Admitting: Rehabilitative and Restorative Service Providers"

## 2016-03-29 ENCOUNTER — Encounter: Payer: Self-pay | Admitting: Rehabilitative and Restorative Service Providers"

## 2016-03-29 DIAGNOSIS — M5412 Radiculopathy, cervical region: Secondary | ICD-10-CM

## 2016-03-29 DIAGNOSIS — M9902 Segmental and somatic dysfunction of thoracic region: Secondary | ICD-10-CM | POA: Diagnosis not present

## 2016-03-29 DIAGNOSIS — M531 Cervicobrachial syndrome: Secondary | ICD-10-CM | POA: Diagnosis not present

## 2016-03-29 DIAGNOSIS — R29898 Other symptoms and signs involving the musculoskeletal system: Secondary | ICD-10-CM

## 2016-03-29 DIAGNOSIS — M9901 Segmental and somatic dysfunction of cervical region: Secondary | ICD-10-CM | POA: Diagnosis not present

## 2016-03-29 DIAGNOSIS — M791 Myalgia: Secondary | ICD-10-CM | POA: Diagnosis not present

## 2016-03-29 NOTE — Therapy (Signed)
[facility] [place] [address] [place] [place], [place], [address] Phone: [phone]   Fax:  [phone]  Physical Therapy Treatment  Patient Details  Name: Carla Watts MRN: [id number] Date of Birth: [birth year] Referring Provider: Dr Benjamin Stain  Encounter Date: 03/29/2016      PT End of Session - 03/29/16 1550    Visit Number 6   Number of Visits 12   Date for PT Re-Evaluation 05/10/16   PT Start Time 1430   PT Stop Time 1521   PT Time Calculation (min) 51 min   Activity Tolerance Patient tolerated treatment well      Past Medical History:  Diagnosis Date  . Chicken pox   . GERD (gastroesophageal reflux disease)     Past Surgical History:  Procedure Laterality Date  . CERVICAL BIOPSY  W/ LOOP ELECTRODE EXCISION    . CESAREAN SECTION    . laparoscopy of uterus    . OOPHORECTOMY Right 2002    There were no vitals filed for this visit.      Subjective Assessment - 03/29/16 1553    Subjective Was able to invert on her silks Tuesday without pain with split silks. She continued to have pain with single slik. She had pain with inversion again Thursday. Does feel encouraged by her progress. Has been consistent with HEP.    Currently in Pain? No/denies            [facility] PT Assessment - 03/29/16 0001      Assessment   Medical Diagnosis Cervical radiculopathy   Referring Provider Dr Benjamin Stain   Onset Date/Surgical Date 11/03/11   Hand Dominance Right   Next MD Visit PRN   Prior Therapy chriopractic care ~ 4 yrs ago      AROM   Cervical Flexion 63   Cervical Extension 50   Cervical - Right Side Bend 35   Cervical - Left Side Bend 35   Cervical - Right Rotation 72   Cervical - Left Rotation 74     Strength   Overall Strength Comments 5/5 bilat UE's except Rt shd flex, horiz abd 5-/5     Palpation   Spinal mobility mid thoracic tightness with CPA and lateral mobs greatest Rt T4/5/6   Palpation comment  muscular tightness through Rt > Lt thoracic paraspinals; Rt lower trap; bilat upper traps; pecs Rt > Lt                      [facility] Adult PT Treatment/Exercise - 03/29/16 0001      Shoulder Exercises: Stretch   Other Shoulder Stretches 3 way doorway stretch 30 sec x 3 each position      Moist Heat Therapy   Number Minutes Moist Heat 15 Minutes   Moist Heat Location --  thoracic spine / traps      Electrical Stimulation   Electrical Stimulation Location Rt thoracic parascapular musculature    Electrical Stimulation Action IFC   Electrical Stimulation Parameters to tolerance   Electrical Stimulation Goals Tone;Pain     Manual Therapy   Manual therapy comments pt prone   Soft tissue mobilization Rt thoracic and periscapular musculature    Myofascial Release Rt posterior thoracic           Trigger Point Dry Needling - 03/29/16 1620    Consent Given? Yes   Muscles Treated Upper Body --  Rt shoudler girdle - lower trap decreased tightness    Levator Scapulae Response Palpable  increased muscle length   Rhomboids Response Palpable increased muscle length   Longissimus Response Palpable increased muscle length  thoracic                   PT Long Term Goals - 03/29/16 1624      PT LONG TERM GOAL #1   Title Improve posture and alignment with patient to demonstrate improved upright posture through head, neck and shoulders 05/10/16   Time 12   Period Weeks   Status Revised     PT LONG TERM GOAL #2   Title Increase cervical ROM, moblity by 5-8 degrees in lateral flexion 05/10/16   Time 12   Period Weeks   Status Revised     PT LONG TERM GOAL #3   Title Decrease pain in thoracic spine with inversion on silks from 9/10 to 0-3/10 05/10/16   Time 12   Period Weeks   Status Revised     PT LONG TERM GOAL #4   Title Independent HEP 05/10/16   Time 12   Period Weeks   Status Revised     PT LONG TERM GOAL #5   Title Improve FOTO to </=  % limitation 05/10/16    Time 12   Period Weeks   Status Revised               Plan - 03/29/16 1621    Clinical Impression Statement Progress with consistent HEP and PT. Christi was able to invert on silks without pain this week. Activity is not consistently painfree at this time but she is making progress. Continued PT is indicated to progress with rehab toward maximum therapeutic benefit.    Rehab Potential Good   PT Frequency 1x / week   PT Duration 6 weeks   PT Treatment/Interventions Patient/family education;ADLs/Self Care Home Management;Cryotherapy;Electrical Stimulation;Iontophoresis 4mg /ml Dexamethasone;Moist Heat;Ultrasound;Dry needling;Manual techniques;Therapeutic activities;Therapeutic exercise   PT Next Visit Plan Dry Needling as indicated, progress with posterior shoulder girdle strengthening; manual work Therapist, nutritionalinc thoracic mobs; modalities as indicated   Consulted and Agree with Plan of Care Patient      Patient will benefit from skilled therapeutic intervention in order to improve the following deficits and impairments:  Postural dysfunction, Improper body mechanics, Pain, Increased fascial restricitons, Increased muscle spasms, Decreased activity tolerance  Visit Diagnosis: Cervical radiculopathy - Plan: PT plan of care cert/re-cert  Other symptoms and signs involving the musculoskeletal system - Plan: PT plan of care cert/re-cert     Problem List Patient Active Problem List   Diagnosis Date Noted  . Left cervical radiculopathy 01/22/2016  . Left ankle pain 01/22/2016  . Peroneal tendonitis of left lower extremity 07/05/2015  . Vitamin D deficiency 11/25/2014  . Palpitations 11/24/2014    Carla Watts Rober MinionP Michelena Culmer PT, MPH  03/29/2016, 4:35 PM  [facility] [address] [place] [address] [place], [place], [address] Phone: [phone]   Fax:  [phone]  Name: Carla Watts MRN: [id number] Date of Birth: 06/07/1965

## 2016-04-03 ENCOUNTER — Encounter: Payer: BLUE CROSS/BLUE SHIELD | Admitting: Physical Therapy

## 2016-04-12 ENCOUNTER — Encounter: Payer: Self-pay | Admitting: Rehabilitative and Restorative Service Providers"

## 2016-04-12 ENCOUNTER — Ambulatory Visit (INDEPENDENT_AMBULATORY_CARE_PROVIDER_SITE_OTHER): Payer: BLUE CROSS/BLUE SHIELD | Admitting: Rehabilitative and Restorative Service Providers"

## 2016-04-12 DIAGNOSIS — R29898 Other symptoms and signs involving the musculoskeletal system: Secondary | ICD-10-CM | POA: Diagnosis not present

## 2016-04-12 DIAGNOSIS — M5412 Radiculopathy, cervical region: Secondary | ICD-10-CM | POA: Diagnosis not present

## 2016-04-12 NOTE — Therapy (Signed)
[facility] [address] [place] [address] [place], [place], [address] Phone: [phone]   Fax:  [phone]  Physical Therapy Treatment  Patient Details  Name: Carla Watts MRN: [id number] Date of Birth: 04/24/1965 Referring Provider: Dr Benjamin Stainhekkekandam  Encounter Date: 04/12/2016      PT End of Session - 04/12/16 1541    Visit Number 7   Number of Visits 12   Date for PT Re-Evaluation 05/10/16   PT Start Time 1533   PT Stop Time 1628   PT Time Calculation (min) 55 min   Activity Tolerance Patient tolerated treatment well      Past Medical History:  Diagnosis Date  . Chicken pox   . GERD (gastroesophageal reflux disease)     Past Surgical History:  Procedure Laterality Date  . CERVICAL BIOPSY  W/ LOOP ELECTRODE EXCISION    . CESAREAN SECTION    . laparoscopy of uterus    . OOPHORECTOMY Right 2002    There were no vitals filed for this visit.      Subjective Assessment - 04/12/16 1611    Subjective Was able to invert on her silks and return from inverted position without pain with split silks and sometimes with single silk - not every day. Does feel encouraged by her progress. Has been consistent with HEP working every other day. Thinks she would like to continue therapy every other week. Knows she needs to continue to work on strength in order to accomplish goal of pain free silk work.    Pertinent History Fx clavical as a child ? rt or lt, displaced rib treated with chiropractic care ~ 4 years ago   Currently in Pain? No/denies                         [facility] Adult PT Treatment/Exercise - 04/12/16 0001      Shoulder Exercises: Prone   Other Prone Exercises prone W's; horizontal abduction x 10 each      Shoulder Exercises: Stretch   Other Shoulder Stretches 3 way doorway stretch 30 sec x 3 each position      Moist Heat Therapy   Number Minutes Moist Heat 20 Minutes   Moist Heat Location --  thoracic  spine / traps      Electrical Stimulation   Electrical Stimulation Location Rt/Lt thoracic parascapular musculature    Electrical Stimulation Action IFC   Electrical Stimulation Parameters to tolerance   Electrical Stimulation Goals Tone;Pain     Manual Therapy   Manual therapy comments pt prone   Soft tissue mobilization Rt/Lt thoracic and periscapular musculature    Myofascial Release Rt/Lt  posterior thoracic           Trigger Point Dry Needling - 04/12/16 1616    Consent Given? Yes   Muscles Treated Upper Body --  lower trap fibers - DN Rt/Lt - decreased tightness    Levator Scapulae Response Palpable increased muscle length;Twitch response elicited   Rhomboids Response Palpable increased muscle length;Twitch response elicited   Longissimus Response Palpable increased muscle length;Twitch response elicited              PT Education - 04/12/16 1620    Education provided Yes   Education Details HEP - will also add weights to prone exercise; increase to blue TB for standing exercises   Person(s) Educated Patient   Methods Explanation;Demonstration;Tactile cues;Verbal cues;Handout   Comprehension Verbalized understanding;Returned demonstration;Verbal cues required;Tactile cues required  PT Long Term Goals - 04/12/16 1620      PT LONG TERM GOAL #1   Title Improve posture and alignment with patient to demonstrate improved upright posture through head, neck and shoulders 05/10/16   Time 12   Period Weeks   Status On-going     PT LONG TERM GOAL #2   Title Increase cervical ROM, moblity by 5-8 degrees in lateral flexion 05/10/16   Time 12   Period Weeks   Status On-going     PT LONG TERM GOAL #3   Title Decrease pain in thoracic spine with inversion on silks from 9/10 to 0-3/10 05/10/16   Time 12   Period Weeks   Status On-going     PT LONG TERM GOAL #4   Title Independent HEP 05/10/16   Time 12   Period Weeks   Status On-going     PT LONG TERM  GOAL #5   Title Improve FOTO to </=  % limitation 05/10/16   Time 12   Period Weeks   Status On-going               Plan - 04/12/16 1621    Clinical Impression Statement Continued progress with inversion on silks and return to relaxed position. Can now work with silks some days without any pain and when she has pain it is typically after she has returned from inverted position and releases the silks. Progressing toward goals of therapy.    Rehab Potential Good   PT Frequency 1x / week   PT Duration 6 weeks   PT Treatment/Interventions Patient/family education;ADLs/Self Care Home Management;Cryotherapy;Electrical Stimulation;Iontophoresis 4mg /ml Dexamethasone;Moist Heat;Ultrasound;Dry needling;Manual techniques;Therapeutic activities;Therapeutic exercise   PT Next Visit Plan Dry Needling as indicated, progress with posterior shoulder girdle strengthening; manual work Barista mobs; modalities as indicated - decrease to every other week with patient to continue to work on strengthening at home    [employer] and Agree with Plan of Care Patient      Patient will benefit from skilled therapeutic intervention in order to improve the following deficits and impairments:  Postural dysfunction, Improper body mechanics, Pain, Increased fascial restricitons, Increased muscle spasms, Decreased activity tolerance  Visit Diagnosis: Cervical radiculopathy  Other symptoms and signs involving the musculoskeletal system     Problem List Patient Active Problem List   Diagnosis Date Noted  . Left cervical radiculopathy 01/22/2016  . Left ankle pain 01/22/2016  . Peroneal tendonitis of left lower extremity 07/05/2015  . Vitamin D deficiency 11/25/2014  . Palpitations 11/24/2014    Carla Watts PT, MPH  04/12/2016, 4:24 PM  [facility] [address] [place] [address] [place], [place], [address] Phone: [phone]   Fax:  [phone]  Name:  Carla Watts MRN: [id number] Date of Birth: [birth year]

## 2016-04-12 NOTE — Patient Instructions (Addendum)
Shoulder Blade Squeeze: W    Arms out to sides at 90 palms down. Bend elbows to 90. Press pelvis down. Squeeze backbone with shoulder blades. Raise arms, front of shoulders, chest, and head. Keep neck neutral. Hold _3-5__ seconds. Relax. Repeat _10__ times. 1-3 sets 1 time per day   Shoulder Blade Squeeze: Airplane    Arms out to sides at 90, elbows straight, palms down. Press pelvis down. Squeeze backbone with shoulder blades. Raise arms, front of shoulders, chest, and head. Keep neck neutral. Hold _3-5__ seconds. Relax. Repeat __10_ times. 1-3 sets 1 time per day     Add 1/2 to 1 pound weights to prone exercises as tolerated

## 2016-04-24 ENCOUNTER — Encounter: Payer: Self-pay | Admitting: Physical Therapy

## 2016-04-24 ENCOUNTER — Ambulatory Visit (INDEPENDENT_AMBULATORY_CARE_PROVIDER_SITE_OTHER): Payer: BLUE CROSS/BLUE SHIELD | Admitting: Physical Therapy

## 2016-04-24 DIAGNOSIS — R29898 Other symptoms and signs involving the musculoskeletal system: Secondary | ICD-10-CM | POA: Diagnosis not present

## 2016-04-24 DIAGNOSIS — M5412 Radiculopathy, cervical region: Secondary | ICD-10-CM

## 2016-04-24 NOTE — Therapy (Signed)
[facility] [address] [place] [address] [place], [place], [address] Phone: [phone]   Fax:  [phone]  Physical Therapy Treatment  Patient Details  Name: Carla Watts MRN: [id number] Date of Birth: 12/08/1965 Referring Provider: Dr Benjamin Stainhekkekandam  Encounter Date: 04/24/2016      PT End of Session - 04/24/16 1522    Visit Number 8   Number of Visits 12   Date for PT Re-Evaluation 05/10/16   PT Start Time 1522   PT Stop Time 1623   PT Time Calculation (min) 61 min      Past Medical History:  Diagnosis Date  . Chicken pox   . GERD (gastroesophageal reflux disease)     Past Surgical History:  Procedure Laterality Date  . CERVICAL BIOPSY  W/ LOOP ELECTRODE EXCISION    . CESAREAN SECTION    . laparoscopy of uterus    . OOPHORECTOMY Right 2002    There were no vitals filed for this visit.      Subjective Assessment - 04/24/16 1522    Subjective Pt reports her progress fluctuates, can't find a pattern except for when she inverts on the silks. Has done more strenthening over the last week.    Currently in Pain? No/denies                         [facility] Adult [facility] Treatment/Exercise - 04/24/16 0001      Shoulder Exercises: Prone   Other Prone Exercises plank work, with arm step ups on step FWD and lateral, with rows and plank walking, side plank with ER.      Shoulder Exercises: Stretch   Other Shoulder Stretches 3 way doorway stretch 30 sec x 3 each position    Other Shoulder Stretches pec stetch and mid back with strap in door     Modalities   Modalities Electrical Stimulation;Moist Heat     Moist Heat Therapy   Number Minutes Moist Heat 15 Minutes   Moist Heat Location --  thoracic area     Electrical Stimulation   Electrical Stimulation Location Lt thoracic   Electrical Stimulation Action IFC   Electrical Stimulation Parameters to tolerance   Electrical Stimulation Goals Tone;Pain     Manual  Therapy   Manual therapy comments pt prone   Soft tissue mobilization Rt thoracic and periscapular musculature    Myofascial Release Rt thoracic          Trigger Point Dry Needling - 04/24/16 1550    Consent Given? Yes   Education Handout Provided No   Muscles Treated Upper Body Rhomboids;Levator scapulae;Subscapularis   Levator Scapulae Response Palpable increased muscle length;Twitch response elicited  Rt   Rhomboids Response Palpable increased muscle length;Twitch response elicited  rt   Subscapularis Response Palpable increased muscle length;Twitch response elicited  rt                   PT Long Term Goals - 04/24/16 1631      PT LONG TERM GOAL #1   Title Improve posture and alignment with patient to demonstrate improved upright posture through head, neck and shoulders 05/10/16   Status On-going     PT LONG TERM GOAL #2   Title Increase cervical ROM, moblity by 5-8 degrees in lateral flexion 05/10/16   Status On-going     PT LONG TERM GOAL #3   Title Decrease pain in thoracic spine with inversion on silks from 9/10 to 0-3/10  05/10/16   Status On-going     PT LONG TERM GOAL #4   Title Independent HEP 05/10/16   Status On-going     PT LONG TERM GOAL #5   Title Improve FOTO to </=  % limitation 05/10/16   Status On-going               Plan - 04/24/16 1628    Clinical Impression Statement Carla Watts is reporting she continues to see improvement, the exercise are getting a little easy.  She tolerated higher level exercise well. Had some trigger points in the Rt rhomboids and tightness in the Rt subscap.  Tolerated manual work and DN well.  Is most likely getting close to discharge   Rehab Potential Good   PT Duration 6 weeks   PT Treatment/Interventions Patient/family education;ADLs/Self Care Home Management;Cryotherapy;Electrical Stimulation;Iontophoresis 4mg /ml Dexamethasone;Moist Heat;Ultrasound;Dry needling;Manual techniques;Therapeutic  activities;Therapeutic exercise   PT Next Visit Plan Dry Needling as indicated, progress with posterior shoulder girdle strengthening; manual work Barista mobs; modalities as indicated - decrease to every other week with patient to continue to work on strengthening at home    [employer] and Agree with Plan of Care Patient      Patient will benefit from skilled therapeutic intervention in order to improve the following deficits and impairments:  Postural dysfunction, Improper body mechanics, Pain, Increased fascial restricitons, Increased muscle spasms, Decreased activity tolerance  Visit Diagnosis: Cervical radiculopathy  Other symptoms and signs involving the musculoskeletal system     Problem List Patient Active Problem List   Diagnosis Date Noted  . Left cervical radiculopathy 01/22/2016  . Left ankle pain 01/22/2016  . Peroneal tendonitis of left lower extremity 07/05/2015  . Vitamin D deficiency 11/25/2014  . Palpitations 11/24/2014    Roderic Scarce PT  04/24/2016, 4:32 PM  [facility] [address] [place], [place], [address] Phone: [phone]   Fax:  [phone]  Name: Carla Watts MRN: [id number] Date of Birth: Sep 07, 1965

## 2016-04-25 ENCOUNTER — Ambulatory Visit (INDEPENDENT_AMBULATORY_CARE_PROVIDER_SITE_OTHER): Payer: BLUE CROSS/BLUE SHIELD | Admitting: Family Medicine

## 2016-04-25 ENCOUNTER — Encounter: Payer: Self-pay | Admitting: Family Medicine

## 2016-04-25 VITALS — BP 112/73 | HR 69 | Temp 98.2°F | Resp 20 | Wt 126.2 lb

## 2016-04-25 DIAGNOSIS — H8112 Benign paroxysmal vertigo, left ear: Secondary | ICD-10-CM

## 2016-04-25 MED ORDER — MECLIZINE HCL 25 MG PO TABS: 25.0000 mg | ORAL_TABLET | Freq: Three times a day (TID) | ORAL | 0 refills | Status: DC | PRN

## 2016-04-25 NOTE — Patient Instructions (Addendum)
Benign Positional Vertigo Introduction Vertigo is the feeling that you or your surroundings are moving when they are not. Benign positional vertigo is the most common form of vertigo. The cause of this condition is not serious (is benign). This condition is triggered by certain movements and positions (is positional). This condition can be dangerous if it occurs while you are doing something that could endanger you or others, such as driving. What are the causes? In many cases, the cause of this condition is not known. It may be caused by a disturbance in an area of the inner ear that helps your brain to sense movement and balance. This disturbance can be caused by a viral infection (labyrinthitis), head injury, or repetitive motion. What increases the risk? This condition is more likely to develop in:  Women.  People who are 50 years of age or older. What are the signs or symptoms? Symptoms of this condition usually happen when you move your head or your eyes in different directions. Symptoms may start suddenly, and they usually last for less than a minute. Symptoms may include:  Loss of balance and falling.  Feeling like you are spinning or moving.  Feeling like your surroundings are spinning or moving.  Nausea and vomiting.  Blurred vision.  Dizziness.  Involuntary eye movement (nystagmus). Symptoms can be mild and cause only slight annoyance, or they can be severe and interfere with daily life. Episodes of benign positional vertigo may return (recur) over time, and they may be triggered by certain movements. Symptoms may improve over time. How is this diagnosed? This condition is usually diagnosed by medical history and a physical exam of the head, neck, and ears. You may be referred to a health care provider who specializes in ear, nose, and throat (ENT) problems (otolaryngologist) or a provider who specializes in disorders of the nervous system (neurologist). You may have  additional testing, including:  MRI.  A CT scan.  Eye movement tests. Your health care provider may ask you to change positions quickly while he or she watches you for symptoms of benign positional vertigo, such as nystagmus. Eye movement may be tested with an electronystagmogram (ENG), caloric stimulation, the Dix-Hallpike test, or the roll test.  An electroencephalogram (EEG). This records electrical activity in your brain.  Hearing tests. How is this treated? Usually, your health care provider will treat this by moving your head in specific positions to adjust your inner ear back to normal. Surgery may be needed in severe cases, but this is rare. In some cases, benign positional vertigo may resolve on its own in 2-4 weeks. Follow these instructions at home: Safety  Move slowly.Avoid sudden body or head movements.  Avoid driving.  Avoid operating heavy machinery.  Avoid doing any tasks that would be dangerous to you or others if a vertigo episode would occur.  If you have trouble walking or keeping your balance, try using a cane for stability. If you feel dizzy or unstable, sit down right away.  Return to your normal activities as told by your health care provider. Ask your health care provider what activities are safe for you. General instructions  Take over-the-counter and prescription medicines only as told by your health care provider.  Avoid certain positions or movements as told by your health care provider.  Drink enough fluid to keep your urine clear or pale yellow.  Keep all follow-up visits as told by your health care provider. This is important. Contact a health care provider if:    You have a fever.  Your condition gets worse or you develop new symptoms.  Your family or friends notice any behavioral changes.  Your nausea or vomiting gets worse.  You have numbness or a "pins and needles" sensation. Get help right away if:  You have difficulty speaking or  moving.  You are always dizzy.  You faint.  You develop severe headaches.  You have weakness in your legs or arms.  You have changes in your hearing or vision.  You develop a stiff neck.  You develop sensitivity to light. This information is not intended to replace advice given to you by your health care provider. Make sure you discuss any questions you have with your health care provider. Document Released: 11/26/2005 Document Revised: 07/27/2015 Document Reviewed: 06/13/2014  2017 Elsevier   Epley Maneuver Self-Care WHAT IS THE EPLEY MANEUVER? The Epley maneuver is an exercise you can do to relieve symptoms of benign paroxysmal positional vertigo (BPPV). This condition is often just referred to as vertigo. BPPV is caused by the movement of tiny crystals (canaliths) inside your inner ear. The accumulation and movement of canaliths in your inner ear causes a sudden spinning sensation (vertigo) when you move your head to certain positions. Vertigo usually lasts about 30 seconds. BPPV usually occurs in just one ear. If you get vertigo when you lie on your left side, you probably have BPPV in your left ear. Your health care provider can tell you which ear is involved.  BPPV may be caused by a head injury. Many people older than 50 get BPPV for unknown reasons. If you have been diagnosed with BPPV, your health care provider may teach you how to do this maneuver. BPPV is not life threatening (benign) and usually goes away in time.  WHEN SHOULD I PERFORM THE EPLEY MANEUVER? You can do this maneuver at home whenever you have symptoms of vertigo. You may do the Epley maneuver up to 3 times a day until your symptoms of vertigo go away. HOW SHOULD I DO THE EPLEY MANEUVER? 1. Sit on the edge of a bed or table with your back straight. Your legs should be extended or hanging over the edge of the bed or table.  2. Turn your head halfway toward the affected ear.  3. Lie backward quickly with your  head turned until you are lying flat on your back. You may want to position a pillow under your shoulders.  4. Hold this position for 30 seconds. You may experience an attack of vertigo. This is normal. Hold this position until the vertigo stops. 5. Then turn your head to the opposite direction until your unaffected ear is facing the floor.  6. Hold this position for 30 seconds. You may experience an attack of vertigo. This is normal. Hold this position until the vertigo stops. 7. Now turn your whole body to the same side as your head. Hold for another 30 seconds.  8. You can then sit back up. ARE THERE RISKS TO THIS MANEUVER? In some cases, you may have other symptoms (such as changes in your vision, weakness, or numbness). If you have these symptoms, stop doing the maneuver and call your health care provider. Even if doing these maneuvers relieves your vertigo, you may still have dizziness. Dizziness is the sensation of light-headedness but without the sensation of movement. Even though the Epley maneuver may relieve your vertigo, it is possible that your symptoms will return within 5 years. WHAT SHOULD I DO AFTER   THIS MANEUVER? After doing the Epley maneuver, you can return to your normal activities. Ask your doctor if there is anything you should do at home to prevent vertigo. This may include:  Sleeping with two or more pillows to keep your head elevated.  Not sleeping on the side of your affected ear.  Getting up slowly from bed.  Avoiding sudden movements during the day.  Avoiding extreme head movement, like looking up or bending over.  Wearing a cervical collar to prevent sudden head movements. WHAT SHOULD I DO IF MY SYMPTOMS GET WORSE? Call your health care provider if your vertigo gets worse. Call your provider right way if you have other symptoms, including:   Nausea.  Vomiting.  Headache.  Weakness.  Numbness.  Vision changes. This information is not intended to  replace advice given to you by your health care provider. Make sure you discuss any questions you have with your health care provider. Document Released: 02/23/2013 Document Reviewed: 02/23/2013 Elsevier Interactive Patient Education  2017 Elsevier Inc.  

## 2016-04-25 NOTE — Progress Notes (Signed)
Carla Watts , 03/26/1965, 51 y.o., female MRN: [id number] Patient Care Team    Relationship Specialty Notifications Start End  Natalia Leatherwoodenee A Kuneff, DO PCP - General Family Medicine  11/24/14     CC: dizziness Subjective: Pt presents for an acute OV with complaints of dizziness since this morning.  Associated symptoms include vomit x1 (when taking a sip of coffee). She states the room spins left when she turns her head, mostly to the left. She states she has not been ill, her son had a virus about 10 days ago. She report she has not been drinking as much water as her usual. 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 She denies fever, chills, diarrhea, nausea, tinnitus, URI sx or headache.   Allergies  Allergen Reactions  . Meloxicam Rash  . Penicillins Rash   Social History  Substance Use Topics  . Smoking status: Never Smoker  . Smokeless tobacco: Never Used  . Alcohol use Yes     Comment: socially   Past Medical History:  Diagnosis Date  . Chicken pox   . GERD (gastroesophageal reflux disease)    Past Surgical History:  Procedure Laterality Date  . CERVICAL BIOPSY  W/ LOOP ELECTRODE EXCISION    . CESAREAN SECTION    . laparoscopy of uterus    . OOPHORECTOMY Right 2002   Family History  Problem Relation Age of Onset  . Hypertension Mother   . Thyroid disease Mother   . Breast cancer Mother   . Stroke Father   . Heart attack Maternal Grandfather   . Heart disease Maternal Grandfather [id number]    Died of MI  . Arthritis Maternal Grandmother     RA  . Thyroid disease Maternal Grandmother   . Alcohol abuse Paternal Grandfather   . Ovarian cancer Paternal Aunt   . Brain cancer Paternal Uncle   . Thyroid disease Maternal Aunt    Allergies as of 04/25/2016      Reactions   Meloxicam Rash   Penicillins Rash      Medication List       Accurate as of 04/25/16 10:41 AM. Always use your most recent med list.          multivitamin  capsule Take 1 capsule by mouth daily.       No results found for this or any previous visit (from the past 24 hour(s)). No results found.   ROS: Negative, with the exception of above mentioned in HPI   Objective:  BP 112/73 (BP Location: Left Arm, Patient Position: Sitting, Cuff Size: Normal)   Pulse 69   Temp 98.2 F (36.8 C)   Resp 20   Wt 126 lb 4 oz (57.3 kg)   SpO2 100%   BMI 23.09 kg/m  Body mass index is 23.09 kg/m. Gen: Afebrile. No acute distress. Nontoxic in appearance, well developed, well nourished.  HENT: AT. [place]. Bilateral TM visualized no erythema, no buldging. MMM, no oral lesions. Bilateral nares WNL. Throat without erythema or exudates. No cough Eyes:Pupils Equal Round Reactive to light, Extraocular movements intact,  Conjunctiva without redness, discharge or icterus. Neck/lymp/endocrine: Supple,no lymphadenopathy CV: RRR  Neuro:  Normal gait (walking cautiously). PERLA. EOMi. Alert. Oriented x3 Cranial nerves II through XII intact. Muscle strength 5/5 bilateral extremity. No neuro deficits. Positive dix-hallpike Left with horizontal nystgmus   Assessment/Plan: Carla Watts is a 51 y.o. female present for acute OV for  Benign paroxysmal positional  vertigo of left ear - Rest, HYDRATE.  - Multiple Vitamin (MULTIVITAMIN) capsule; Take 1 capsule by mouth daily. - meclizine (ANTIVERT) 25 MG tablet; Take 1 tablet (25 mg total) by mouth 3 (three) times daily as needed.  Dispense: 30 tablet; Refill: 0 - Ambulatory referral to Physical Therapy (vestibular rehab) - F/U 1-2 weeks if not improved.    electronically signed by:  Felix Pacini, DO  [facility] - [place]

## 2016-04-26 ENCOUNTER — Ambulatory Visit
Payer: BLUE CROSS/BLUE SHIELD | Attending: Family Medicine | Admitting: Rehabilitative and Restorative Service Providers"

## 2016-04-26 DIAGNOSIS — R2689 Other abnormalities of gait and mobility: Secondary | ICD-10-CM

## 2016-04-26 DIAGNOSIS — R42 Dizziness and giddiness: Secondary | ICD-10-CM | POA: Insufficient documentation

## 2016-04-26 NOTE — Patient Instructions (Signed)
Gaze Stabilization - Tip Card  1.Target must remain in focus, not blurry, and appear stationary while head is in motion. 2.Perform exercises with small head movements (45 to either side of midline). 3.Increase speed of head motion so long as target is in focus. 4.If you wear eyeglasses, be sure you can see target through lens (therapist will give specific instructions for bifocal / progressive lenses). 5.These exercises may provoke dizziness or nausea. Work through these symptoms. If too dizzy, slow head movement slightly. Rest between each exercise. 6.Exercises demand concentration; avoid distractions. 7.For safety, perform standing exercises close to a counter, wall, corner, or next to someone.  Copyright  VHI. All rights reserved.   Gaze Stabilization - Standing Feet Apart   Feet shoulder width apart, keeping eyes on target on wall 3 feet away, tilt head down slightly and move head side to side for 30 seconds. Repeat while moving head up and down for 30 seconds. *Work up to tolerating 60 seconds, as able. Do 2-3 sessions per day.   Copyright  VHI. All rights reserved.   Habituation - Tip Card  1.The goal of habituation training is to assist in decreasing symptoms of vertigo, dizziness, or nausea provoked by specific head and body motions. 2.These exercises may initially increase symptoms; however, be persistent and work through symptoms. With repetition and time, the exercises will assist in reducing or eliminating symptoms. 3.Exercises should be stopped and discussed with the therapist if you experience any of the following: - Sudden change or fluctuation in hearing - New onset of ringing in the ears, or increase in current intensity - Any fluid discharge from the ear - Severe pain in neck or back - Extreme nausea  Copyright  VHI. All rights reserved.  Habituation - Sit to Side-Lying   Sit on edge of bed. Lie down onto the right side and hold until dizziness stops, plus 20  seconds.  Return to sitting and wait until dizziness stops, plus 20 seconds.  Repeat to the left side. Repeat sequence 5 times per session. Do 2 sessions per day.  * 45 degrees of head rotation  Copyright  VHI. All rights reserved.   Head Motion: Side to Side    Sitting, tilt head down slightly, slowly move head to right with eyes open. Hold position until symptoms subside. Then, move head slowly to opposite side. Hold position until symptoms subside. Repeat _5___ times per session. Do _2___ sessions per day.  Copyright  VHI. All rights reserved.

## 2016-04-26 NOTE — Therapy (Signed)
[facility] [address] [place], [place], [address] Phone: [phone]   Fax:  [phone]  Physical Therapy Evaluation  Patient Details  Name: Carla Watts MRN: [id number] Date of Birth: 13-Jul-1965 Referring Provider: Dr. Elvina Sidle  Encounter Date: 04/26/2016      PT End of Session - 04/26/16 1513    Visit Number 1   Number of Visits 4   Date for PT Re-Evaluation 05/26/16   Authorization Type BC[employer]   PT Start Time 1320   PT Stop Time 1405   PT Time Calculation (min) 45 min   Activity Tolerance Patient tolerated treatment well   Behavior During Therapy [facility] for tasks assessed/performed      Past Medical History:  Diagnosis Date  . Chicken pox   . GERD (gastroesophageal reflux disease)     Past Surgical History:  Procedure Laterality Date  . CERVICAL BIOPSY  W/ LOOP ELECTRODE EXCISION    . CESAREAN SECTION    . laparoscopy of uterus    . OOPHORECTOMY Right 2002    There were no vitals filed for this visit.       Subjective Assessment - 04/26/16 1238    Subjective The patient has h/o intermittent BPPV x 20 years, however symptoms would improve when she got out of the provoking position.  She noted onset of worsening symptoms yesterday described as a sensation that the room was moving to the left, ear fullness, and n/v.  She remained in bed all day yesterday.  She feels that meclizine has reduced overall nausea and symptoms.    Patient took meclizine this morning.   Patient Stated Goals *vestibular- reduce symptoms of dizziness*   Currently in Pain? No/denies            [facility] PT Assessment - 04/26/16 1242      Assessment   Medical Diagnosis BPPV, left ear   Referring Provider Dr. Elvina Sidle   Onset Date/Surgical Date 04/25/16   Prior Therapy being currently seen in [place] for orthopedic OP PT     Precautions   Precautions None     Restrictions   Weight Bearing Restrictions No      Balance Screen   Has the patient fallen in the past 6 months No   Has the patient had a decrease in activity level because of a fear of falling?  Yes  due to not feeling well over past 24 hours   Is the patient reluctant to leave their home because of a fear of falling?  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     Symptom Behavior   Type of Dizziness --  environmental movement   Frequency of Dizziness daily   Duration of Dizziness constant   Aggravating Factors Activity in general   Relieving Factors Head stationary     Occulomotor Exam   Occulomotor Alignment Normal  Spontaneous Right beating nystagmus  with rotation noted with frenzels donned   Gaze-induced Right beating nystagmus with R gaze  with frenzels donned   Head shaking Horizontal R beating nystagmus   Smooth Pursuits Intact     Vestibulo-Occular Reflex   VOR 1 Head Only (x 1 viewing) self regulated pace provokes a mild sensation of dizziness x 5 reps   Comment Head impulse test=positive bilaterally for refixation saccade back to target (greater amplitude refixation on the left side)     Positional Testing   Dix-Hallpike Dix-Hallpike Right;Dix-Hallpike Left   Horizontal Canal Testing Horizontal Canal Right;Horizontal Canal Left     Dix-Hallpike Right   Dix-Hallpike Right Duration 60+ seconds   Dix-Hallpike Right Symptoms Upbeat, right rotatory nystagmus  *may be more related to tonic firing imbalance      Dix-Hallpike Left   Dix-Hallpike Left Duration low amplitude nystagmus seen   Dix-Hallpike Left Symptoms --  in room light appears right beating     Horizontal Canal Right   Horizontal Canal Right Duration none   Horizontal Canal Right Symptoms Normal     Horizontal Canal Left   Horizontal Canal Left Duration none   Horizontal Canal Left Symptoms Normal                Vestibular Treatment/Exercise - 04/26/16 1302      Vestibular Treatment/Exercise   Vestibular Treatment Provided Habituation;Gaze   Habituation Exercises Brandt Daroff;Seated Horizontal Head Turns   Gaze Exercises X1 Viewing Horizontal;X1 Viewing Vertical     Austin MilesBrandt Daroff   Number of Reps  2   Symptom Description  Patient noted greater symptoms with L sidelying versus R sidelying -- no nystagmus in room light due to visual fixation "when I stare at something, it feels better".  Patient has dec'd symptoms on second repetition     Seated Horizontal Head Turns   Number of Reps  5   Symptom Description  mild dizziness, also used to help reduce neck guarding from recent onset of symptoms.               PT Education - 04/26/16 1511    Education provided Yes   Education Details HEP: vestibular exercises for brandt daroff, gaze x 1 viewing, horizontal head motion   Person(s) Educated Patient   Methods Explanation;Demonstration;Handout   Comprehension Verbalized understanding;Returned demonstration             PT Long Term Goals - 04/26/16 1514      PT LONG TERM GOAL #1   Title The patient will be indep with HEP for gaze x 1 adaptation, habituation for motion sensitivity.   Baseline Target date 05/25/2016   Time 4   Period Weeks     PT LONG TERM GOAL #2   Title The patient will tolerate gaze x 1 viewing x 30 seconds without c/o visual blurring or dizziness.   Baseline Target date 05/25/2016   Time 4   Period Weeks     PT LONG TERM GOAL #3   Title The patient will tolerate bed mobility  without reports of dizziness.   Baseline Target date 05/25/2016   Time 4   Period Weeks     PT LONG TERM GOAL #4   Title The patient will reduce DHI from 72% to < or equal to 50% to demo dec'd self perception of vertigo   Baseline Target date 05/25/2016   Time 4   Period Weeks  Plan - 04/26/16 1650    Clinical Impression Statement The patient is a 51 yo female presenting with acute onset of vertigo with different subjective characteristics than prior episodic BPPV.  Her clinical exam today is consistent with a L hypofunction noted through R beating nystagmus with R gaze with frenzels donned, + head impulse test to the left, + head shaking nystagmus test for L hypofunction.  She does have positive positional testing worse with R dix hallpike today, but that finding would also be consistent with a L acute hypofunction.  She notes having L positional symptoms yesterday that improved with MD treatment/home activities.  PT provided brandt daroff habituation to address BPPV or hypofunction, gaze x 1 viewing.   Rehab Potential Good   PT Frequency 1x / week   PT Duration 4 weeks   PT Treatment/Interventions Therapeutic activities;Therapeutic exercise;ADLs/Self Care Home Management;Neuromuscular re-education;Canalith Repostioning;Vestibular;Patient/family education;Functional mobility training;Gait training   PT Next Visit Plan VESTIBULAR:  progress gaze, habituation HEP.  Retest and tx canolith repositining if indicated.  Functional moblity.   Consulted and Agree with Plan of Care Patient      Patient will benefit from skilled therapeutic intervention in order to improve the following deficits and impairments:  Decreased activity tolerance, Dizziness, Abnormal gait  Visit Diagnosis: Dizziness and giddiness  Other abnormalities of gait and mobility     Problem List Patient Active Problem List   Diagnosis Date Noted  . Left cervical radiculopathy 01/22/2016  . Left ankle  pain 01/22/2016  . Peroneal tendonitis of left lower extremity 07/05/2015  . Vitamin D deficiency 11/25/2014  . Palpitations 11/24/2014    Tsuneo Faison, PT 04/26/2016, 4:55 PM  [facility] [address] [place], [place], [address] Phone: [phone]   Fax:  [phone]  Name: Sylver Vantassell MRN: [id number] Date of Birth: 12-19-1965

## 2016-05-02 ENCOUNTER — Ambulatory Visit
Payer: BLUE CROSS/BLUE SHIELD | Attending: Family Medicine | Admitting: Rehabilitative and Restorative Service Providers"

## 2016-05-02 DIAGNOSIS — R42 Dizziness and giddiness: Secondary | ICD-10-CM | POA: Diagnosis not present

## 2016-05-02 DIAGNOSIS — R2689 Other abnormalities of gait and mobility: Secondary | ICD-10-CM | POA: Diagnosis not present

## 2016-05-02 NOTE — Therapy (Signed)
[facility] [address] [place], [name], [address] Phone: [phone]   Fax:  [phone]  Physical Therapy Treatment  Patient Details  Name: Carla Watts MRN: [id number] Date of Birth: December 06, 1965 Referring Provider: Dr. Dorathy Daft  Encounter Date: 05/02/2016      PT End of Session - 05/02/16 0849    Visit Number 2   Number of Visits 4   Date for PT Re-Evaluation 05/26/16   Authorization Type BCBS   PT Start Time 0808   PT Stop Time 0846   PT Time Calculation (min) 38 min   Activity Tolerance Patient tolerated treatment well   Behavior During Therapy [facility] for tasks assessed/performed      Past Medical History:  Diagnosis Date  . Chicken pox   . GERD (gastroesophageal reflux disease)     Past Surgical History:  Procedure Laterality Date  . CERVICAL BIOPSY  W/ LOOP ELECTRODE EXCISION    . CESAREAN SECTION    . laparoscopy of uterus    . OOPHORECTOMY Right 2002    There were no vitals filed for this visit.      Subjective Assessment - 05/02/16 0809    Subjective The patient does not have dizziness at this time with day to day activities, however still does not feel back to "normal".  She has had 2 episodes of positional type symptoms that lasted for seconds when lying down into bed.  She is still having some difficulty with the letter exercise (gaze exercise).  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     Patient Stated Goals *vestibular- reduce symptoms of dizziness*   Currently in Pain? No/denies                Vestibular Assessment - 05/02/16 0817      Vestibular Assessment   General Observation Patient notes no baseline dizziness.  Patient notes photophobia and phonophobia noting keeping lights down and noise lower.   Patient notes with prior positional testing symptoms slowed, but never stopped.      Positional Testing   Dix-Hallpike Dix-Hallpike Right;Dix-Hallpike Left   Sidelying Test Sidelying Right;Sidelying Left   Horizontal Canal Testing Horizontal Canal Right;Horizontal Canal Left     Dix-Hallpike Right   Dix-Hallpike Right Duration none   Dix-Hallpike Right Symptoms No nystagmus     Dix-Hallpike Left   Dix-Hallpike Left Duration none   Dix-Hallpike Left Symptoms No nystagmus     Sidelying Right   Sidelying Right Duration sensation it could come on   Sidelying Right Symptoms No nystagmus     Sidelying Left   Sidelying Left Duration sensation it could come on   Sidelying Left Symptoms No nystagmus     Horizontal Canal Right   Horizontal Canal Right Duration mild sensation   Horizontal Canal Right Symptoms Normal     Horizontal Canal Left   Horizontal Canal Left Duration mild sensation   Horizontal Canal Left Symptoms Normal                  Vestibular Treatment/Exercise - 05/02/16 1244      Vestibular Treatment/Exercise   Vestibular Treatment Provided Habituation;Gaze   Habituation Exercises Laruth Bouchard Daroff;Horizontal Roll   Gaze Exercises X1 Viewing Horizontal;X1 Viewing Vertical     Nestor Lewandowsky   Number of Reps  1   Symptom Description  patient able to perform for HEP     Horizontal Roll   Number  of Reps  1   Symptom Description  patient able to perform for HEP, no spinning, just mild sensation of dizziness     X1 Viewing Horizontal   Foot Position standing feet apart--wrote down progression to feet narrow.   Comments x 30 seconds with reports of retinal slip per visual blurring     X1 Viewing Vertical   Foot Position standing with feet apart   Comments x 30 seconds.      SELF CARE/HOME MANAGEMENT: Discussed goals of PT and progress since initiating HEP.  Recommended continuing gaze and habituation to tolerance and progressing recreational activities as able.            PT Education - 05/02/16 0847    Education provided Yes   Education Details HEP:  Reviewed updated HEP of gaze and habituation   Person(s) Educated Patient   Methods Explanation;Demonstration;Handout   Comprehension Verbalized understanding;Returned demonstration             PT Long Term Goals - 05/02/16 1244      PT LONG TERM GOAL #1   Title The patient will be indep with HEP for gaze x 1 adaptation, habituation for motion sensitivity.   Baseline Met on 05/02/2016   Time 4   Period Weeks   Status Achieved     PT LONG TERM GOAL #2   Title The patient will tolerate gaze x 1 viewing x 30 seconds without c/o visual blurring or dizziness.   Baseline Target date 05/25/2016   Time 4   Period Weeks   Status On-going     PT LONG TERM GOAL #3   Title The patient will tolerate bed mobility without reports of dizziness.   Baseline Met on 05/02/2016   Time 4   Period Weeks   Status Achieved     PT LONG TERM GOAL #4   Title The patient will reduce DHI from 72% to < or equal to 50% to demo dec'd self perception of vertigo   Baseline Target date 05/25/2016   Time 4   Period Weeks   Status On-going               Plan - 05/02/16 0850    Clinical Impression Statement The patient continues with impairments of diminished VOR and motion sensitivity.  PT provided updated HEP and will f/u in 2 weeks as patient needs.  Expect vestibular adaptation to be further along in 2 weeks and patient to call and cancel remaining visits if symptoms resolved.   PT Frequency 1x / week   PT Duration 4 weeks   PT Treatment/Interventions Therapeutic activities;Therapeutic exercise;ADLs/Self Care Home Management;Neuromuscular re-education;Canalith Repostioning;Vestibular;Patient/family education;Functional mobility training;Gait training   PT Next Visit Plan f/u as indicated to check HEP and symptoms.  D/c as patient returns to prior funciton.   Consulted and Agree with Plan of Care Patient      Patient will benefit from skilled therapeutic intervention in order to improve the  following deficits and impairments:  Decreased activity tolerance, Dizziness, Abnormal gait  Visit Diagnosis: Dizziness and giddiness  Other abnormalities of gait and mobility     Problem List Patient Active Problem List   Diagnosis Date Noted  . Left cervical radiculopathy 01/22/2016  . Left ankle pain 01/22/2016  . Peroneal tendonitis of left lower extremity 07/05/2015  . Vitamin D deficiency 11/25/2014  . Palpitations 11/24/2014    Neaveh Belanger PT 05/02/2016, 12:47 PM  [facility] [address]  [place], [name], [address] Phone: [phone]   Fax:  [phone]  Name: Carla Watts MRN: [id number] Date of Birth: 1965/12/28

## 2016-05-02 NOTE — Patient Instructions (Signed)
Gaze Stabilization - Tip Card  1.Target must remain in focus, not blurry, and appear stationary while head is in motion. 2.Perform exercises with small head movements (45 to either side of midline). 3.Increase speed of head motion so long as target is in focus. 4.If you wear eyeglasses, be sure you can see target through lens (therapist will give specific instructions for bifocal / progressive lenses). 5.These exercises may provoke dizziness or nausea. Work through these symptoms. If too dizzy, slow head movement slightly. Rest between each exercise. 6.Exercises demand concentration; avoid distractions. 7.For safety, perform standing exercises close to a counter, wall, corner, or next to someone.  Copyright  VHI. All rights reserved.   Gaze Stabilization - Standing Feet Apart   Feet shoulder width apart, keeping eyes on target on wall 3 feet away, tilt head down slightly and move head side to side for 30 seconds. Repeat while moving head up and down for 30 seconds. *Work up to tolerating 60 seconds, as able. Do 2-3 sessions per day.  HOW TO PROGRESS:  1) First, work up to 60 seconds.    2)  Once you tolerate, 60 seconds for 3-5 days, move foot position to feet together and eventually to heel/toe standing (see image below and keep a chair/wall nearby for balance).  When you change foot position, go back to 30 seconds.  Work up to 60 seconds with that foot position.  And then progress to more narrow standing x 30 seconds.  Gaze Stabilization: Standing Feet Heel-Toe "Tandem"    Feet in full heel-toe position, keeping eyes on target on wall __3__ feet away, tilt head down slightly and move head side to side for __30__ seconds. Repeat while moving head up and down for _30___ seconds. Do _2-3___ sessions per day. Repeat using target on pattern background.  Copyright  VHI. All rights reserved.    Habituation - Tip Card WHEN YOU HAVE 2 DAYS IN A ROW WITHOUT SYMPTOMS DURING THESE  EXERCISES, YOU CAN STOP DOING THESE.  KEEP THESE AROUND TO TREAT FUTURE POSITIONAL VERTIGO OR MOTION SENSITIVITY.  1.The goal of habituation training is to assist in decreasing symptoms of vertigo, dizziness, or nausea provoked by specific head and body motions. 2.These exercises may initially increase symptoms; however, be persistent and work through symptoms. With repetition and time, the exercises will assist in reducing or eliminating symptoms. 3.Exercises should be stopped and discussed with the therapist if you experience any of the following: - Sudden change or fluctuation in hearing - New onset of ringing in the ears, or increase in current intensity - Any fluid discharge from the ear - Severe pain in neck or back - Extreme nausea  Copyright  VHI. All rights reserved.   Habituation - Rolling   With pillow under head, start on back. Roll to your right side.  Hold until dizziness stops, plus 20 seconds and then roll to the left side.  Hold until dizziness stops, plus 20 seconds.  Repeat sequence 5 times per session. Do 2 sessions per day.  Copyright  VHI. All rights reserved.          Habituation - Sit to Side-Lying   Sit on edge of bed. Lie down onto the right side and hold until dizziness stops, plus 20 seconds.  Return to sitting and wait until dizziness stops, plus 20 seconds.  Repeat to the left side. Repeat sequence 5 times per session. Do 2 sessions per day.  Copyright  VHI. All rights reserved.

## 2016-05-03 DIAGNOSIS — M531 Cervicobrachial syndrome: Secondary | ICD-10-CM | POA: Diagnosis not present

## 2016-05-03 DIAGNOSIS — M9901 Segmental and somatic dysfunction of cervical region: Secondary | ICD-10-CM | POA: Diagnosis not present

## 2016-05-03 DIAGNOSIS — M791 Myalgia: Secondary | ICD-10-CM | POA: Diagnosis not present

## 2016-05-03 DIAGNOSIS — M9902 Segmental and somatic dysfunction of thoracic region: Secondary | ICD-10-CM | POA: Diagnosis not present

## 2016-05-08 ENCOUNTER — Encounter: Payer: BLUE CROSS/BLUE SHIELD | Admitting: Physical Therapy

## 2016-05-17 ENCOUNTER — Ambulatory Visit: Payer: BLUE CROSS/BLUE SHIELD | Admitting: Rehabilitative and Restorative Service Providers"

## 2016-05-20 ENCOUNTER — Ambulatory Visit (HOSPITAL_BASED_OUTPATIENT_CLINIC_OR_DEPARTMENT_OTHER): Payer: BLUE CROSS/BLUE SHIELD | Admitting: Genetic Counselor

## 2016-05-20 ENCOUNTER — Encounter: Payer: Self-pay | Admitting: Genetic Counselor

## 2016-05-20 ENCOUNTER — Other Ambulatory Visit: Payer: BLUE CROSS/BLUE SHIELD

## 2016-05-20 DIAGNOSIS — Z8042 Family history of malignant neoplasm of prostate: Secondary | ICD-10-CM

## 2016-05-20 DIAGNOSIS — Z7183 Encounter for nonprocreative genetic counseling: Secondary | ICD-10-CM

## 2016-05-20 DIAGNOSIS — Z808 Family history of malignant neoplasm of other organs or systems: Secondary | ICD-10-CM | POA: Diagnosis not present

## 2016-05-20 DIAGNOSIS — Z8041 Family history of malignant neoplasm of ovary: Secondary | ICD-10-CM

## 2016-05-20 DIAGNOSIS — Z803 Family history of malignant neoplasm of breast: Secondary | ICD-10-CM | POA: Diagnosis not present

## 2016-05-20 NOTE — Progress Notes (Signed)
REFERRING PROVIDER: Ma Hillock, DO [address], [place] [id number]   PRIMARY PROVIDER:  Howard Pouch, DO  PRIMARY REASON FOR VISIT:  1. Family history of breast cancer   2. Family history of ovarian cancer   3. Family history of prostate cancer   4. Family history of brain cancer      HISTORY OF PRESENT ILLNESS:   Carla Watts, a 51 y.o. female, was seen for a [facility] cancer genetics consultation at the request of Dr. Raoul Pitch due to a family history of cancer.  Carla Watts presents to clinic today to discuss the possibility of a hereditary predisposition to cancer, genetic testing, and to further clarify her future cancer risks, as well as potential cancer risks for family members.  Carla Watts is a 51 y.o. female with no personal history of cancer.    CANCER HISTORY:   No history exists.     HORMONAL RISK FACTORS:  Menarche was at age 6-13.  First live birth at age 6.  OCP use for approximately <5 years.  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  Hysterectomy: no.  Menopausal status: premenopausal.  HRT use: 0 years. Colonoscopy: no; not examined. Mammogram within the last year: yes. Number of breast biopsies: 0. Up to date with pelvic exams:  yes. Any excessive radiation exposure in the past:  no  Past Medical History:  Diagnosis Date  . Chicken pox   . Family history of brain cancer   . Family history of breast cancer   . Family history of ovarian cancer   . Family history of prostate cancer   . GERD (gastroesophageal reflux disease)     Past Surgical History:  Procedure Laterality Date  . CERVICAL BIOPSY  W/ LOOP ELECTRODE EXCISION    . CESAREAN SECTION    . laparoscopy of uterus    . OOPHORECTOMY Right 2002    Social History   Social History  . Marital status: Married    Spouse name: N/A  . Number of children: N/A  . Years of education: N/A   Occupational History  . health fitness coach     yoga instructor    Social History Main Topics  . Smoking status: Never Smoker  . Smokeless tobacco: Never Used  . Alcohol use Yes     Comment: socially  . Drug use: No  . Sexual activity: Yes     Comment: husband with vasectomy   Other Topics Concern  . None   Social History Narrative   Married, 3 children.   - drinks some caffeine.   - uses herbal remedies at time.    - Wears a seatbelt, exercises 3x/w, smoke alarm in the home   - Guns in the home, locked case   - Feels safe in relationship     FAMILY HISTORY:  We obtained a detailed, 4-generation family history.  Significant diagnoses are listed below: Family History  Problem Relation Age of Onset  . Hypertension Mother   . Thyroid disease Mother   . Breast cancer Mother 73  . Skin cancer Mother   . Stroke Father   . Skin cancer Father   . Heart attack Maternal Grandfather   . Heart disease Maternal Grandfather 84    Died of MI  . Arthritis Maternal Grandmother     RA  . Thyroid disease Maternal Grandmother   . Alcohol abuse Paternal Grandfather   . Ovarian cancer Paternal Aunt   .  Brain cancer Paternal Uncle   . Thyroid disease Maternal Aunt   . Ovarian cancer Maternal Aunt     unsure if it was ovairan vs something else.    The patient has two daughters and one son who are cancer free.  She has a brother who is cancer free. Her mother as diagnosed with breast cancer at age 6.  She has a brother, sister and paternal half sisters.  The half sister was diagnosed with possible ovarian cancer.  There is no other cancer on the maternal side of the family.  The patient's father has had skin cancer.  He had three brothers and three sisters.  One brother had a brain tumor, and one sister had ovarian cancer.  One paternal cousin has prostate cancer.  There is no other cancer on the paternal side of the family.  Carla Watts is unaware of previous family history of genetic testing for hereditary cancer risks. Patient's maternal ancestors are  of [place] descent, and paternal ancestors are of [place] descent. There is no reported Ashkenazi Jewish ancestry. There is no known consanguinity.  GENETIC COUNSELING ASSESSMENT: Carla Watts is a 51 y.o. female with a family history of breast, ovarian, brain and prostate cancer which is somewhat suggestive of a hereditary cancer syndrome and predisposition to cancer. We, therefore, discussed and recommended the following at today's visit.   DISCUSSION: We discussed that about 5-10% of breast cancer and 20% ovarian cancer is hereditary, most commonly due to BRCA mutations.  There are other mutations that can be associated with hereditary cancer syndromes, including ATM, CHEK2, PALB2 for breast cancer and BRIP1, RAD51C/D and Lynch syndrome mutations for ovarian cancer.  We reviewed the characteristics, features and inheritance patterns of hereditary cancer syndromes. We also discussed genetic testing, including the appropriate family members to test, the process of testing, insurance coverage and turn-around-time for results. We discussed the implications of a negative, positive and/or variant of uncertain significant result. We recommended Carla Watts pursue genetic testing for the Common Hereditary cancer gene panel. The Hereditary Gene Panel offered by [facility] includes sequencing and/or deletion duplication testing of the following 43 genes: APC, ATM, AXIN2, BARD1, BMPR1A, BRCA1, BRCA2, BRIP1, CDH1, CDKN2A (p14ARF), CDKN2A (p16INK4a), CHEK2, DICER1, EPCAM (Deletion/duplication testing only), GREM1 (promoter region deletion/duplication testing only), KIT, MEN1, MLH1, MSH2, MSH6, MUTYH, NBN, NF1, PALB2, PDGFRA, PMS2, POLD1, POLE, PTEN, RAD50, RAD51C, RAD51D, SDHB, SDHC, SDHD, SMAD4, SMARCA4. STK11, TP53, TSC1, TSC2, and VHL.  The following gene was evaluated for sequence changes only: SDHA and HOXB13 c.251G>A variant only.  Based on Carla Watts's family history of cancer, she meets medical criteria for  genetic testing. Despite that she meets criteria, she may still have an out of pocket cost. We discussed that if her out of pocket cost for testing is over $100, the laboratory will call and confirm whether she wants to proceed with testing.  If the out of pocket cost of testing is less than $100 she will be billed by the genetic testing laboratory.   PLAN: After considering the risks, benefits, and limitations, Carla Watts  provided informed consent to pursue genetic testing and the blood sample was sent to [facility] for analysis of the Common Hereditary cancer panel. Results should be available within approximately 2-3 weeks' time, at which point they will be disclosed by telephone to Carla Watts, as will any additional recommendations warranted by these results. Carla Watts will receive a summary of her genetic counseling visit and a copy of her  results once available. This information will also be available in Epic. We encouraged Carla Watts to remain in contact with cancer genetics annually so that we can continuously update the family history and inform her of any changes in cancer genetics and testing that may be of benefit for her family. Carla Watts questions were answered to her satisfaction today. Our contact information was provided should additional questions or concerns arise.  Lastly, we encouraged Carla Watts to remain in contact with cancer genetics annually so that we can continuously update the family history and inform her of any changes in cancer genetics and testing that may be of benefit for this family.   Ms.  Watts questions were answered to her satisfaction today. Our contact information was provided should additional questions or concerns arise. Thank you for the referral and allowing [place] to share in the care of your patient.   Feras Gardella P. Florene Glen, [place], [facility] Certified Genetic Counselor [email or web address] phone: [phone]  The patient was seen for a total of 45 minutes  in face-to-face genetic counseling.  This patient was discussed with Drs. Magrinat, Lindi Adie and/or Burr Medico who agrees with the above.    _______________________________________________________________________ For Office Staff:  Number of people involved in session: 1 Was an Intern/ student involved with case: no

## 2016-05-27 DIAGNOSIS — M9901 Segmental and somatic dysfunction of cervical region: Secondary | ICD-10-CM | POA: Diagnosis not present

## 2016-05-27 DIAGNOSIS — M791 Myalgia: Secondary | ICD-10-CM | POA: Diagnosis not present

## 2016-05-27 DIAGNOSIS — M531 Cervicobrachial syndrome: Secondary | ICD-10-CM | POA: Diagnosis not present

## 2016-05-27 DIAGNOSIS — M9902 Segmental and somatic dysfunction of thoracic region: Secondary | ICD-10-CM | POA: Diagnosis not present

## 2016-05-30 ENCOUNTER — Telehealth: Payer: Self-pay | Admitting: Family Medicine

## 2016-05-30 NOTE — Telephone Encounter (Signed)
Patient thinks she has a muscle contusion and wanted to know if she should get seen at the sports medicine office of her choosing or if she should get scheduled for Monday with Dr. Claiborne BillingsKuneff. I suggested to patient that I could go ahead and schedule her for Monday with Dr. Claiborne BillingsKuneff just in case she needs it before the slots fill up and send a note however, she refused for me to do that. Patient only wanted me to send a note to the CMA for Dr. Claiborne BillingsKuneff. Patient stated she was going to call the sports medicine office of her choosing to get scheduled.

## 2016-06-03 ENCOUNTER — Encounter: Payer: Self-pay | Admitting: Family Medicine

## 2016-06-03 ENCOUNTER — Telehealth: Payer: Self-pay | Admitting: Family Medicine

## 2016-06-03 ENCOUNTER — Ambulatory Visit: Payer: BLUE CROSS/BLUE SHIELD | Admitting: Sports Medicine

## 2016-06-03 ENCOUNTER — Ambulatory Visit (INDEPENDENT_AMBULATORY_CARE_PROVIDER_SITE_OTHER): Payer: BLUE CROSS/BLUE SHIELD | Admitting: Family Medicine

## 2016-06-03 VITALS — BP 109/72 | HR 76 | Temp 98.4°F | Resp 20 | Wt 124.8 lb

## 2016-06-03 DIAGNOSIS — T148XXA Other injury of unspecified body region, initial encounter: Secondary | ICD-10-CM

## 2016-06-03 NOTE — Telephone Encounter (Signed)
noted 

## 2016-06-03 NOTE — Progress Notes (Signed)
Carla Watts , January 28, 1966, 51 y.o., female MRN: [id number] Patient Care Team    Relationship Specialty Notifications Start End  Natalia Leatherwood, DO PCP - General Family Medicine  11/24/14     CC: Muscle contusion Subjective: Pt presents for an  OV with complaints of left thigh bruise of 3 weeks duration.  Associated symptoms include discoloration and swelling. Patient states she was working out with [employer] cough, when one wrapped around her leg upon descent. After this she noticed some discomfort, swelling and bruising to her right medial thigh. Overall she felt that it has been improving over the last 3 weeks, discoloration has almost all resolved. She feels still swollen. She denies discomfort at this time. She states there is a small area that is mildly numb. She denies any dizziness, tingling or changes in temperature of her lower extremity.  Depression screen PHQ 2/9 01/12/2016  Decreased Interest 0  Down, Depressed, Hopeless 0  PHQ - 2 Score 0    Allergies  Allergen Reactions  . Meloxicam Rash  . Penicillins Rash   Social History  Substance Use Topics  . Smoking status: Never Smoker  . Smokeless tobacco: Never Used  . Alcohol use Yes     Comment: socially   Past Medical History:  Diagnosis Date  . Chicken pox   . Family history of brain cancer   . Family history of breast cancer   . Family history of ovarian cancer   . Family history of prostate cancer   . GERD (gastroesophageal reflux disease)    Past Surgical History:  Procedure Laterality Date  . CERVICAL BIOPSY  W/ LOOP ELECTRODE EXCISION    . CESAREAN SECTION    . laparoscopy of uterus    . OOPHORECTOMY Right 2002   Family History  Problem Relation Watts of Onset  . Hypertension Mother   . Thyroid disease Mother   . Breast cancer Mother 69  . Skin cancer Mother   . Stroke Father   . Skin cancer Father   . Heart attack Maternal Grandfather   . Heart disease Maternal Grandfather 74    Died of MI  .  Arthritis Maternal Grandmother     RA  . Thyroid disease Maternal Grandmother   . Alcohol abuse Paternal Grandfather   . Ovarian cancer Paternal Aunt   . Brain cancer Paternal Uncle   . Thyroid disease Maternal Aunt   . Ovarian cancer Maternal Aunt     unsure if it was ovairan vs something else.   Allergies as of 06/03/2016      Reactions   Meloxicam Rash   Penicillins Rash      Medication List       Accurate as of 06/03/16  4:31 PM. Always use your most recent med list.          meclizine 25 MG tablet Commonly known as:  ANTIVERT Take 1 tablet (25 mg total) by mouth 3 (three) times daily as needed.   multivitamin capsule Take 1 capsule by mouth daily.   Vitamin D-3 1000 units Caps Take 1,000 Units by mouth.       No results found for this or any previous visit (from the past 24 hour(s)). No results found.   ROS: Negative, with the exception of above mentioned in HPI   Objective:  BP 109/72 (BP Location: Right Arm, Patient Position: Sitting, Cuff Size: Normal)   Pulse 76   Temp 98.4 F (36.9 C)   Resp 20  Wt 124 lb 12 oz (56.6 kg)   SpO2 97%   BMI 22.82 kg/m  Body mass index is 22.82 kg/m. Gen: Afebrile. No acute distress. Nontoxic in appearance, well developed, well nourished.  HENT: AT. [place]. MMM, no oral lesions. Eyes:Pupils Equal Round Reactive to light, Extraocular movements intact,  Conjunctiva without redness, discharge or icterus. CV: RRR  Skin: No rashes, purpura or petechiae. Approximately this size area of mild swelling left medial thigh. Very mild greenish hue/bruise on skin. No tenderness to palpation, no obvious hematoma mass palpated. Distal pulses intact and equal bilaterally. Mild sensation changes focally over the bruised area. Neuro: Normal gait. PERLA. EOMi. Alert. Oriented x3   Assessment/Plan: Carla Watts is a 51 y.o. female present for OV for muscle contusion Muscle contusion - 85 weeks old, healing. She does have some mild  numbness over the area ~size of fist. Very faint greenish hematoma remains with mild swelling.  - compression, heat, hydration, promote circulation, light stretches. - No obvious hematoma palpated, discussed with patient options on treatment plan. Would advise compression/heat for another 3 weeks. If not improving, or if area is worsening with that time consider possible imaging. Patient is agreeable with plan. Follow-up only needed if not improving, or worsening.   Reviewed expectations re: course of current medical issues.  Discussed self-management of symptoms.  Outlined signs and symptoms indicating need for more acute intervention.  Patient verbalized understanding and all questions were answered.  Patient received an After-Visit Summary.   electronically signed by:  Felix Pacini, DO  [facility] [place]

## 2016-06-03 NOTE — Patient Instructions (Signed)
Compression and heat, movement. Promote blood flow.   If not improved, or worsening in 3 weeks please be seen and we will consider image at that time.     Hematoma A hematoma is a collection of blood. The collection of blood can turn into a hard, painful lump under the skin. Your skin may turn blue or yellow if the hematoma is close to the surface of the skin. Most hematomas get better in a few days to weeks. Some hematomas are serious and need medical care. Hematomas can be very small or very big. Follow these instructions at home:  Apply ice to the injured area:  Put ice in a plastic bag.  Place a towel between your skin and the bag.  Leave the ice on for 20 minutes, 2-3 times a day for the first 1 to 2 days.  After the first 2 days, switch to using warm packs on the injured area.  Raise (elevate) the injured area to lessen pain and puffiness (swelling). You may also wrap the area with an elastic bandage. Make sure the bandage is not wrapped too tight.  If you have a painful hematoma on your leg or foot, you may use crutches for a couple days.  Only take medicines as told by your doctor. Get help right away if:  Your pain gets worse.  Your pain is not controlled with medicine.  You have a fever.  Your puffiness gets worse.  Your skin turns more blue or yellow.  Your skin over the hematoma breaks or starts bleeding.  Your hematoma is in your chest or belly (abdomen) and you are short of breath, feel weak, or have a change in consciousness.  Your hematoma is on your scalp and you have a headache that gets worse or a change in alertness or consciousness. This information is not intended to replace advice given to you by your health care provider. Make sure you discuss any questions you have with your health care provider. Document Released: 03/28/2004 Document Revised: 07/27/2015 Document Reviewed: 07/29/2012 Elsevier Interactive Patient Education  2017 [employer].

## 2016-06-05 ENCOUNTER — Ambulatory Visit: Payer: BLUE CROSS/BLUE SHIELD | Admitting: Sports Medicine

## 2016-06-06 ENCOUNTER — Telehealth: Payer: Self-pay | Admitting: Rehabilitative and Restorative Service Providers"

## 2016-06-06 ENCOUNTER — Telehealth: Payer: Self-pay | Admitting: Genetic Counselor

## 2016-06-06 NOTE — Telephone Encounter (Signed)
Left voice message to return call (request left with front office/scheduling). Mykelle Cockerell, PT

## 2016-06-06 NOTE — Telephone Encounter (Signed)
LM on VM that results are back and left CB instructions.

## 2016-06-06 NOTE — Telephone Encounter (Signed)
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  Discussed that CHEK2 is a moderate risk gene.  THe patient's particular variant is a low risk mutation within this moderate risk gene.  The remainder of the testing was negative.  Patient will come in for discussion on April 16.

## 2016-06-07 ENCOUNTER — Ambulatory Visit: Payer: BLUE CROSS/BLUE SHIELD | Admitting: Rehabilitative and Restorative Service Providers"

## 2016-06-17 ENCOUNTER — Ambulatory Visit: Payer: BLUE CROSS/BLUE SHIELD | Admitting: Genetic Counselor

## 2016-06-17 DIAGNOSIS — Z808 Family history of malignant neoplasm of other organs or systems: Secondary | ICD-10-CM

## 2016-06-17 DIAGNOSIS — Z1501 Genetic susceptibility to malignant neoplasm of breast: Secondary | ICD-10-CM | POA: Insufficient documentation

## 2016-06-17 DIAGNOSIS — Z8042 Family history of malignant neoplasm of prostate: Secondary | ICD-10-CM

## 2016-06-17 DIAGNOSIS — Z1509 Genetic susceptibility to other malignant neoplasm: Secondary | ICD-10-CM

## 2016-06-17 DIAGNOSIS — Z1502 Genetic susceptibility to malignant neoplasm of ovary: Secondary | ICD-10-CM

## 2016-06-17 DIAGNOSIS — Z8041 Family history of malignant neoplasm of ovary: Secondary | ICD-10-CM

## 2016-06-17 DIAGNOSIS — Z1379 Encounter for other screening for genetic and chromosomal anomalies: Secondary | ICD-10-CM

## 2016-06-17 DIAGNOSIS — Z803 Family history of malignant neoplasm of breast: Secondary | ICD-10-CM

## 2016-06-17 DIAGNOSIS — Z1589 Genetic susceptibility to other disease: Secondary | ICD-10-CM

## 2016-06-17 NOTE — Progress Notes (Signed)
GENETIC TEST RESULTS   Patient Name: Carla Watts Patient Age: 51 y.o. Encounter Date: 06/17/2016  Referring Provider: Howard Pouch, DO    Carla Watts was seen in the [place] clinic on June 17, 2016 due to a family history of cancer and concern regarding a hereditary predisposition to cancer in the family. Please refer to the prior Genetics clinic note for more information regarding Carla Watts's medical and family histories and our assessment at the time.   FAMILY HISTORY:  We obtained a detailed, 4-generation family history.  Significant diagnoses are listed below: Family History  Problem Relation Age of Onset  . Hypertension Mother   . Thyroid disease Mother   . Breast cancer Mother 15  . Skin cancer Mother   . Stroke Father   . Skin cancer Father   . Heart attack Maternal Grandfather   . Heart disease Maternal Grandfather 50    Died of MI  . Arthritis Maternal Grandmother     RA  . Thyroid disease Maternal Grandmother   . Alcohol abuse Paternal Grandfather   . Ovarian cancer Paternal Aunt   . Brain cancer Paternal Uncle   . Thyroid disease Maternal Aunt   . Ovarian cancer Maternal Aunt     unsure if it was ovairan vs something else.    The patient has two daughters and one son who are cancer free.  She has a brother who is cancer free. Her mother as diagnosed with breast cancer at age 46.  She has a brother, sister and paternal half sisters.  The half sister was diagnosed with possible ovarian cancer.  There is no other cancer on the maternal side of the family.  The patient's father has had skin cancer.  He had three brothers and three sisters.  One brother had a brain tumor, and one sister had ovarian cancer.  One paternal cousin has prostate cancer.  There is no other cancer on the paternal side of the family.  Carla Watts is unaware of previous family history of genetic testing for hereditary cancer risks. Patient's maternal ancestors are of [place]  descent, and paternal ancestors are of [place] descent. There is no reported Ashkenazi Jewish ancestry. There is no known consanguinity.  GENETIC TESTING:  At the time of Carla Watts's visit, we recommended she pursue genetic testing of the Multi-gene cancer panel. 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 There were no deleterious mutations in: ALK, APC, ATM, AXIN2,BAP1,  BARD1, BLM, BMPR1A, BRCA1, BRCA2, BRIP1, CASR, CDC73, CDH1, CDK4, CDKN1B, CDKN1C, CDKN2A (p14ARF), CDKN2A (p16INK4a), CEBPA, DICER1, CIS3L2, EGFR (c.2369C>T, p.Thr790Met variant only), EPCAM (Deletion/duplication testing only), FH, FLCN, GATA2, GPC3, GREM1 (Promoter region deletion/duplication testing only), HOXB13 (c.251G>A, p.Gly84Glu), HRAS, KIT, MAX, MEN1, MET, MITF (c.952G>A, p.Glu318Lys variant only), MLH1, MSH2, MSH6, MUTYH, NBN, NF1, NF2, PALB2, PDGFRA, PHOX2B, PMS2, POLD1, POLE, POT1, PRKAR1A, PTCH1, PTEN, RAD50, RAD51C, RAD51D, RB1, RECQL4, RET, RUNX1, SDHAF2, SDHA (sequence changes only), SDHB, SDHC, SDHD, SMAD4, SMARCA4, SMARCB1, SMARCE1, STK11, SUFU, TERT, TERT, TMEM127, TP53, TSC1, TSC2, VHL, WRN and WT1.  Marland Kitchen  DISCUSSION: CHEK2 mutations have been found to be associated with an increased risk of breast and other cancers. The estimated cancer risks vary widely and may be influenced by family history. Women with a CHEK2 deleterious mutation have approximately a 24% (no family history of breast cancer) to 48% (strong family history of breast  cancer) lifetime risk of breast cancer and up to a 25% risk of a second breast cancer. Men may have an increased risk for female breast cancer of about 1%.  The risk data available are based on the common mutation (1100delC).  The risk for most missense mutations are unclear.  Specifically, the missense mutation found in this patient, the  risk for breast cancer appears to be lower.  However, without more information on these mutations, the patient should be followed based on a higher risk allele.  Men and women may have an increased risk of colon cancer (~10% lifetime risk). According to the NCCN guidelines, individuals with CHEK2 mutations should consider breast MRI's as a part of regular breast cancer screening, and depending on family history could consider a risk-reducing mastectomy.  Breast cancer screening should begin, for women, at age 30 or 31 years younger than the earliest age of onset.  Colon cancer screening should begin at age 29 and continue every 5 years or based on polyp number.    CANCER SCREENING: Below are the NCCN Practice guidelines for women and men.  However, because the breast cancer risks for women and prostate cancer risks for men may be similar, it is appropriate to consider these high risk management recommendations.  Breast Management Options We reviewed the NCCN practice guidelines (v1.2017) for breast management for women at an increased risk of breast cancer because of CHEK2 mutations:   1. Breast awareness (which may include periodic, consistent breast self exam) starting at age 59.  2. Breast screening:  . Starting at age 19, annual mammogram and breast MRI screening, or starting 10 years younger than the earliest age of onset.   Female Breast and Prostate Management Options We reviewed the NCCN practice guidelines (v.1.2017) for female breast and prostate management for men at high risk of female breast and prostate cancer because of BRCA1 or BRCA2 mutations:  1. Breast self-exam training and education starting at age 45.  2. Clinical breast exam, every 6-12 months, starting at age 88 years  65. Consider baseline screening mammogram at age 53 with annual mammograms if gynecomastia or parenchymal/glandular breast density on baseline study.  4. Consider prostate cancer screening starting at age 49 years.    Colon Cancer Management:  Men and women with a deleterious CHEK2 mutation may have up to a 10% lifetime risk for colon cancer. The following is recommended for individuals with a CHEK2 mutation:  Personal history of colon cancer  Follow instructions provided by your physician based on your personal history.  Do not have a personal history of colon cancer but have a parent/sibling/child with colon cancer: Colonoscopy every 5 years starting at age 36 or 72 years younger than the earliest age of onset, whichever is younger.  Do not have a personal history of colon cancer but do not have a parent/sibling/child with colon cancer: Colonoscopy every 5 years starting at age 42.  FAMILY MEMBERS: It is important that all of Ms. Staley's relatives (both men and women) know of the presence of this gene mutation.  Women need to know that they may be at increased risk for breast and colon cancers.  Men are at slightly increased risk for breast, prostate and colon cancers.  Genetic testing can sort out who in your family is at risk and who is not.  We would be happy to help meet with and coordinate genetic testing for any relative that is interested.  Ms. Zecca's children are at 50% risk  to have inherited the mutation found in her. Ms. Cowman children, however, are relatively young and this will not be of any consequence to them for several years. While her daughter is 76, and could undergo genetic testing if she wished, we would not start screening for another 5-6 years.  Her other children are under 18.  We do not test children because there is no risk to them until they are adults.  It should also be kept in mind that for children, we are bound to know a great deal more about breast cancer and its prevention in several years' time. We recommend that Ms. Julin'S children have genetic counseling and testing by the time that they are in their early 20's.    Ms. Alvizo brother and parents are at 50% risk to  have the mutation found in her. We recommend they have genetic testing for this same mutation, as identifying the presence of this mutation would allow them to also take advantage of risk-reducing measures. [facility], the laboratory where Ms. Maziarz was tested, will test first degree relatives for free if they are tested within 90 days of Ms. Bremer's test reporting date.  Ms. Spies was provided information about genetic testing centers near her parents and her brother.  Our knowledge of cancer risks related to CHEK2 mutations will continue to evolve. We recommended that Ms. Vinas follow up with the genetics clinic annually so we can provide her with the most current information about CHEK2 and cancer risk, as well as with any changes to her family history (new cancer diagnoses, genetic test results).  SUPPORT AND RESOURCES:  We provided information about two support groups for hereditary cancer syndrome information and support, Facing Our Risk (www.facingourrisk.com) and Bright Pink (www.brightpink.org) which some people have found useful.  They provide opportunities to speak with other individuals from high-risk families.    Our contact number was provided. Ms. Mcquary questions were answered to her satisfaction, and she knows she is welcome to call us at anytime with additional questions or concerns.   Roma Kayser, MS, [facility] Certified Genetic Counselor [email or web address]

## 2016-06-21 ENCOUNTER — Encounter: Payer: Self-pay | Admitting: Rehabilitative and Restorative Service Providers"

## 2016-06-21 NOTE — Therapy (Signed)
[facility] [address] [place], [name], [address] Phone: [phone]   Fax:  [phone]  Patient Details  Name: Carla Watts MRN: [id number] Date of Birth: 12-Aug-1965 Referring Provider:  No ref. provider found  Encounter Date: last encounter 05/02/16  PHYSICAL THERAPY DISCHARGE SUMMARY  Visits from Start of Care: 2  Current functional level related to goals / functional outcomes:       PT Long Term Goals - 05/02/16 1244      PT LONG TERM GOAL #1   Title The patient will be indep with HEP for gaze x 1 adaptation, habituation for motion sensitivity.   Baseline Met on 05/02/2016   Time 4   Period Weeks   Status Achieved     PT LONG TERM GOAL #2   Title The patient will tolerate gaze x 1 viewing x 30 seconds without c/o visual blurring or dizziness.   Baseline Target date 05/25/2016   Time 4   Period Weeks   Status On-going     PT LONG TERM GOAL #3   Title The patient will tolerate bed mobility without reports of dizziness.   Baseline Met on 05/02/2016   Time 4   Period Weeks   Status Achieved     PT LONG TERM GOAL #4   Title The patient will reduce DHI from 72% to < or equal to 50% to demo dec'd self perception of vertigo   Baseline Target date 05/25/2016   Time 4   Period Weeks   Status On-going     Patient called to cancel remaining visit due to symptoms improved--did not formally reassess remaining LTGs.    Remaining deficits: Patient returned to prior level of function per phone message that symptoms improved.   Education / Equipment: HEP.  Plan: Patient agrees to discharge.  Patient goals were partially met. Patient is being discharged due to not returning since the last visit.  ?????         Thank you for the referral of this patient. Rudell Cobb, MPT    [name] 06/21/2016, 8:55 AM  [facility] [address]  [place], [name], [address] Phone: [phone]   Fax:  [phone]

## 2016-06-26 DIAGNOSIS — M791 Myalgia: Secondary | ICD-10-CM | POA: Diagnosis not present

## 2016-06-26 DIAGNOSIS — M9902 Segmental and somatic dysfunction of thoracic region: Secondary | ICD-10-CM | POA: Diagnosis not present

## 2016-06-26 DIAGNOSIS — M531 Cervicobrachial syndrome: Secondary | ICD-10-CM | POA: Diagnosis not present

## 2016-06-26 DIAGNOSIS — M9901 Segmental and somatic dysfunction of cervical region: Secondary | ICD-10-CM | POA: Diagnosis not present

## 2016-06-27 ENCOUNTER — Encounter (HOSPITAL_COMMUNITY): Payer: Self-pay

## 2016-07-03 ENCOUNTER — Ambulatory Visit (INDEPENDENT_AMBULATORY_CARE_PROVIDER_SITE_OTHER): Payer: BLUE CROSS/BLUE SHIELD | Admitting: Family Medicine

## 2016-07-03 ENCOUNTER — Encounter: Payer: Self-pay | Admitting: Family Medicine

## 2016-07-03 DIAGNOSIS — S7012XA Contusion of left thigh, initial encounter: Secondary | ICD-10-CM | POA: Insufficient documentation

## 2016-07-03 MED ORDER — DICLOFENAC SODIUM 1 % TD GEL: 2.0000 g | Freq: Four times a day (QID) | TRANSDERMAL | 11 refills | Status: DC

## 2016-07-03 NOTE — Patient Instructions (Addendum)
Thank you for coming in today. Attend PT.  Use a small thigh sleeve. I recommend getting one from [employer].  I also recommend using diclofenac gel.  Recheck in 4-6 weeks or sooner if needed.  Resume activity as tolerated.    Hematoma A hematoma is a collection of blood under the skin, in an organ, in a body space, in a joint space, or in other tissue. The blood can clot to form a lump that you can see and feel. The lump is often firm and may sometimes become sore and tender. Most hematomas get better in a few days to weeks. However, some hematomas may be serious and require medical care. Hematomas can range in size from very small to very large. What are the causes? A hematoma can be caused by a blunt or penetrating injury. It can also be caused by spontaneous leakage from a blood vessel under the skin. Spontaneous leakage from a blood vessel is more likely to occur in older people, especially those taking blood thinners. Sometimes, a hematoma can develop after certain medical procedures. What are the signs or symptoms?  A firm lump on the body.  Possible pain and tenderness in the area.  Bruising.Blue, dark blue, purple-red, or yellowish skin may appear at the site of the hematoma if the hematoma is close to the surface of the skin. For hematomas in deeper tissues or body spaces, the signs and symptoms may be subtle. For example, an intra-abdominal hematoma may cause abdominal pain, weakness, fainting, and shortness of breath. An intracranial hematoma may cause a headache or symptoms such as weakness, trouble speaking, or a change in consciousness. How is this diagnosed? A hematoma can usually be diagnosed based on your medical history and a physical exam. Imaging tests may be needed if your health care provider suspects a hematoma in deeper tissues or body spaces, such as the abdomen, head, or chest. These tests may include ultrasonography or a CT scan. How is this treated? Hematomas  usually go away on their own over time. Rarely does the blood need to be drained out of the body. Large hematomas or those that may affect vital organs will sometimes need surgical drainage or monitoring. Follow these instructions at home:  Apply ice to the injured area:  Put ice in a plastic bag.  Place a towel between your skin and the bag.  Leave the ice on for 20 minutes, 2-3 times a day for the first 1 to 2 days.  After the first 2 days, switch to using warm compresses on the hematoma.  Elevate the injured area to help decrease pain and swelling. Wrapping the area with an elastic bandage may also be helpful. Compression helps to reduce swelling and promotes shrinking of the hematoma. Make sure the bandage is not wrapped too tight.  If your hematoma is on a lower extremity and is painful, crutches may be helpful for a couple days.  Only take over-the-counter or prescription medicines as directed by your health care provider. Get help right away if:  You have increasing pain, or your pain is not controlled with medicine.  You have a fever.  You have worsening swelling or discoloration.  Your skin over the hematoma breaks or starts bleeding.  Your hematoma is in your chest or abdomen and you have weakness, shortness of breath, or a change in consciousness.  Your hematoma is on your scalp (caused by a fall or injury) and you have a worsening headache or a change  in alertness or consciousness. This information is not intended to replace advice given to you by your health care provider. Make sure you discuss any questions you have with your health care provider. Document Released: 10/03/2003 Document Revised: 07/27/2015 Document Reviewed: 07/29/2012 Elsevier Interactive Patient Education  2017 [employer].

## 2016-07-03 NOTE — Progress Notes (Signed)
   Subjective:    I'm seeing this patient as a consultation for:  Felix Pacini, DO   CC: Left leg burise  HPI: Patient participates in recreational acrobatics. She does a Arts development officer routine where her legs are wrapped in [place] ups as she suspended from the ceiling in the manner of a Insurance risk surveyor.  She's been for participating in this exercise hobby for about 7 years without significant injury. This change in mid-March when she had a slip where the so constricted against her leg in a forceful manner. This resulted in a large bruise along the left medial thigh. This caused pain and swelling. However 6-7 weeks later she notes a definite her dad in her medial thigh associated with pain especially to touch. She's been using compression which does help some. She denies any fevers or chills or radiating pain weakness or numbness. She saw her PCP in early April who after she did not improve after several weeks referred her to me.  Past medical history, Surgical history, Family history not pertinant except as noted below, Social history, Allergies, and medications have been entered into the medical record, reviewed, and no changes needed.   Review of Systems: No headache, visual changes, nausea, vomiting, diarrhea, constipation, dizziness, abdominal pain, skin rash, fevers, chills, night sweats, weight loss, swollen lymph nodes, body aches, joint swelling, muscle aches, chest pain, shortness of breath, mood changes, visual or auditory hallucinations.   Objective:    Vitals:   07/03/16 1407  BP: (!) 96/54  Pulse: 78   General: Well Developed, well nourished, and in no acute distress.  Neuro/Psych: Alert and oriented x3, extra-ocular muscles intact, able to move all 4 extremities, sensation grossly intact. Skin: Warm and dry, no rashes noted.  Respiratory: Not using accessory muscles, speaking in full sentences, trachea midline.  Cardiovascular: Pulses palpable, no extremity  edema. Abdomen: Does not appear distended. MSK: Left thigh largely well appearing however there is a slight increase of fat atrophy along the medial thigh. This is mildly tender to palpation. No firm nodules palpated bone. Patient has normal hip and leg motion and a normal gait.  On limited musculoskeletal ultrasound there is a noticeable area of decreased subcutaneous fat associate with small linear area of hypoechoic fluid consistent with fat atrophy and small hematoma. There is no Doppler flow within this hypoechoic change. The underlying musculature and bone are normal-appearing to ultrasoun No results found for this or any previous visit (from the past 24 hour(s)). No results found.  Impression and Recommendations:    Assessment and Plan: 51 y.o. female with Contusion with hematoma to the left medial thigh due to constriction and shear forces. There certainly appears to be a degree of fat atrophy due to this injury. We had a lengthy discussion about the likely outcome and prognosis. Ultimately I think Ms. Theiler will do well. Plan to refer to physical therapy as well as treat with compression. I would recommend avoiding steroid injections if at all possible as this is likely to worsen fat atrophy and not improve it.    Discussed warning signs or symptoms. Please see discharge instructions. Patient expresses understanding.  CC: Felix Pacini, DO

## 2016-07-12 ENCOUNTER — Encounter: Payer: Self-pay | Admitting: Rehabilitative and Restorative Service Providers"

## 2016-07-12 ENCOUNTER — Ambulatory Visit (INDEPENDENT_AMBULATORY_CARE_PROVIDER_SITE_OTHER): Payer: BLUE CROSS/BLUE SHIELD | Admitting: Rehabilitative and Restorative Service Providers"

## 2016-07-12 DIAGNOSIS — M79605 Pain in left leg: Secondary | ICD-10-CM | POA: Diagnosis not present

## 2016-07-12 DIAGNOSIS — R29898 Other symptoms and signs involving the musculoskeletal system: Secondary | ICD-10-CM | POA: Diagnosis not present

## 2016-07-12 NOTE — Patient Instructions (Addendum)
Toe Bend, Sitting (Passive)    Sit, one leg crossed over the other. Place one hand on heel and other over top of toes. Move toes down and up. Hold each position _20-30__ seconds. Repeat _3__ times per session. Do __1-2_ sessions per day.   Ankle Dorsiflexion / Toe Extensors, Standing    Stand, back knee facing forward, toes in line with knee. Gently press front of back foot and ankle toward floor. Hold _10-20__ seconds. Repeat ___ times per session. Do ___ sessions per day. Rolling ankle to stretch intrinsics in the foot    Gastroc, Standing    Stand, right foot behind, heel on floor and turned slightly out, leg straight, forward leg bent. Keeping arms straight, push pelvis forward until stretch is felt in calf. Hold _30__ seconds. Repeat _3__ times per session. Do __1-2_ sessions per day.    Soleus, Standing    Stand, right foot behind, heel on floor and turned slightly out, both knees bent. Lean into wall until stretch is felt in lower calf. Hold _30__ seconds. Repeat _3__ times per session. Do _1-2 __ sessions per day.    Ankle Circles    Slowly rotate right foot and ankle clockwise then counterclockwise. Gradually increase range of motion. Avoid pain. Circle __20-30__ times each direction per set. Do _1-2___ sets per session. Do __1-2__ sessions per day.    Ankle Alphabet    Using left ankle and foot only, trace the letters of the alphabet. Perform A to Z. Repeat __1-2__ times per set. Do _1-2___ sets per session. Do __1-2__ sessions per day.     Toe Curl: Bilateral    With both feet resting on towel, slowly bunch up towel by curling toes.  Repeat __10-15__ times per set. Do __1-2__ sets per session. Do __1__ sessions per day.    Balance: Unilateral    Attempt to balance on left leg, eyes open. Hold __30-60__ seconds. Repeat _3-5___ times per set. Do __1__ sets per session. Do _1-2___ sessions per day. Perform exercise with eyes  closed.    Heel Raise: Bilateral (Standing)    Rise on balls of feet. Repeat __10-20__ times per set. Do _1-2___ sets per session. Do _1___ sessions per day. Can do this with only one leg    Balance: Three-Way Leg Swing    Stand on left foot, hands on hips. Reach other foot forward __10__ times, sideways __10__ times, back __10__ times. Hold each position _1-2___ seconds. Relax. Repeat _2-3___ times per set. Do __1-2__ sets per session. Do _1-2___ sessions per day.    Balance: Unilateral - Forward Lean    Stand on left foot, hands on hips. Keeping hips level, bend forward as if to touch forehead to wall. Hold __3-5__ seconds. Relax. Repeat _10___ times per set. Do __1-3__ sets per session. Do ___1-2_ sessions per day.     All ankle theraband exercises - 2-3 sets of 10 reps - slow reps and fast reps  Dorsiflexion: Resisted    Facing anchor, tubing around left foot, pull toward face.  Repeat ____ times per set. Do ____ sets per session. Do ____ sessions per day.  \ Plantar Flexion: Resisted    Anchor behind, tubing around left foot, press down. Repeat ____ times per set. Do ____ sets per session. Do ____ sessions per day.    Inversion: Resisted    Cross legs with right leg underneath, foot in tubing loop. Hold tubing around other foot to resist and turn foot in. Repeat ____  times per set. Do ____ sets per session. Do ____ sessions per day.   Eversion: Resisted    With right foot in tubing loop, hold tubing around other foot to resist and turn foot out. Repeat ____ times per set. Do ____ sets per session. Do ____ sessions per day.

## 2016-07-12 NOTE — Therapy (Addendum)
[name], [name], [address] Phone: [phone]   Fax:  [phone]  Physical Therapy Evaluation  Patient Details  Name: Carla Watts MRN: [id number] Date of Birth: 1965-09-11 Referring Provider: Dr Georgina Snell   Encounter Date: 07/12/2016      PT End of Session - 07/12/16 1407    Visit Number 1   Number of Visits 6   Date for PT Re-Evaluation 08/23/16   PT Start Time 1155   PT Stop Time 1458   PT Time Calculation (min) 56 min   Activity Tolerance Patient tolerated treatment well      Past Medical History:  Diagnosis Date  . Chicken pox   . Family history of brain cancer   . Family history of breast cancer   . Family history of ovarian cancer   . Family history of prostate cancer   . GERD (gastroesophageal reflux disease)     Past Surgical History:  Procedure Laterality Date  . CERVICAL BIOPSY  W/ LOOP ELECTRODE EXCISION    . CESAREAN SECTION    . laparoscopy of uterus    . OOPHORECTOMY Right 2002    There were no vitals filed for this visit.       Subjective Assessment - 07/12/16 1407    Subjective patient reports that she injured Lt thigh while workingon her silks 05/14/16. She dropped with the silk digging into the Lt medial thigh. She had significant pain at the time of injury. She was seen by MD a few weeks later with xrays (-). She was bruised and swollen. She was treated with wrapping and rest and evaluated by Dr Georgina Snell with US showing some atrophy of fat cells in the area injured.    Pertinent History treated here for Rt mid back/shoulder discomfort/weakness and BPPV; otherwise healthy    Diagnostic tests xrays, [place]    Patient Stated Goals return to normal activities    Currently in Pain? Yes   Pain Score 1    Pain Location Leg   Pain Orientation Left   Pain Descriptors / Indicators Numbness;Nagging;Sore   Pain Type Acute pain   Pain Onset More than a month ago   Pain  Frequency Constant   Aggravating Factors  wrapping with silks; jumping    Pain Relieving Factors wrapping             [facility] PT Assessment - 07/12/16 0001      Assessment   Medical Diagnosis Lt thigh contusion; shear force injury    Referring Provider Dr Georgina Snell    Onset Date/Surgical Date 05/14/16   Hand Dominance Right   Next MD Visit PRN    Prior Therapy for thoracic pain and BPPV     Precautions   Precautions None     Balance Screen   Has the patient fallen in the past 6 months No   Has the patient had a decrease in activity level because of a fear of falling?  No   Is the patient reluctant to leave their home because of a fear of falling?  No     Prior Function   Level of Independence Independent   Vocation Part time employment   Music therapist    Leisure silks; yoga      Observation/Other Assessments   Observations area of indention Lt medial thigh where silks caught thigh as she dropped    Focus on Therapeutic Outcomes (FOTO)  17% limitation  Sensation   Additional Comments numb feeling Lt medial thigh in area of tissue damage      Posture/Postural Control   Posture Comments WFL's     AROM   Overall AROM Comments WNL's bilat LE's      Strength   Overall Strength Comments 5/5 bilat LE's      Palpation   Palpation comment tightness with myofacial restrictions noted medial Lt thigh                    [facility] Adult PT Treatment/Exercise - 07/12/16 0001      Exercises   Exercises --  see HEP printout for exercise program      Moist Heat Therapy   Number Minutes Moist Heat 15 Minutes   Moist Heat Location --  Lt medial thigh      Electrical Stimulation   Electrical Stimulation Location Lt medial thigh    Electrical Stimulation Action IFC   Electrical Stimulation Parameters to tolerance   Electrical Stimulation Goals Edema;Pain     Manual Therapy   Manual therapy comments pt supine    Soft tissue mobilization  deep tissue work to tolerance medial Lt thigh   Myofascial Release Lt medial thigh                 PT Education - 07/12/16 1502    Education provided Yes   Education Details HEP    Person(s) Educated Patient   Methods Explanation;Demonstration;Tactile cues;Verbal cues;Handout   Comprehension Verbalized understanding;Returned demonstration;Verbal cues required;Tactile cues required             PT Long Term Goals - 07/12/16 1522      PT LONG TERM GOAL #1   Title Instruct patient in HEP consisting of deep tissue and myofacial work for Lt medial thigh 07/12/16   Baseline -   Time 1   Period Days   Status Achieved     PT LONG TERM GOAL #2   Title further assessment as indicated 08/23/16   Baseline -   Time 6   Period Weeks   Status New     PT LONG TERM GOAL #3   Title -   Baseline -   Time 0   Status New     PT LONG TERM GOAL #4   Title -   Baseline -   Time 0   Period Weeks   Status New     PT LONG TERM GOAL #5   Title Improve FOTO to </=  15% limitation 08/23/16   Time 6   Period Weeks   Status New               Plan - 07/12/16 1508    Clinical Impression Statement Carla Watts presents with Watts complexity evaluation of contusion of Lt medial thigh. She has normal ROM and strength in bilat LE's. There is tenderness to palpation and myofacial restrictions through area of tissue injury. Patietn reports abnormal sensations in the area. Carla Watts was instructed in myofacial and dep tissue massage to area and encouraged to continue with her regular exercise program. She will call to schedule additioinal appointments as indicated.    Rehab Potential Good   PT Frequency 1x / week   PT Duration 6 weeks   PT Treatment/Interventions Patient/family education;ADLs/Self Care Home Management;Electrical Stimulation;Cryotherapy;Iontophoresis 3m/ml Dexamethasone;Moist Heat;Ultrasound;Dry needling;Manual techniques;Therapeutic activities;Therapeutic  exercise;Neuromuscular re-education   PT Next Visit Plan patient will call to schedule if she wishes to continue with PT. We could  continue with myofacial and deep tissue work and could try DN if patient wants to proceed with additional treatment    Consulted and Agree with Plan of Care Patient      Patient will benefit from skilled therapeutic intervention in order to improve the following deficits and impairments:  Pain, Increased fascial restricitons  Visit Diagnosis: Pain in left leg - Plan: PT plan of care cert/re-cert  Other symptoms and signs involving the musculoskeletal system - Plan: PT plan of care cert/re-cert     Problem List Patient Active Problem List   Diagnosis Date Noted  . Contusion of left thigh 07/03/2016  . Genetic testing 06/17/2016  . Family history of breast cancer   . Family history of ovarian cancer   . Family history of prostate cancer   . Family history of brain cancer   . Left cervical radiculopathy 01/22/2016  . Left ankle pain 01/22/2016  . Peroneal tendonitis of left lower extremity 07/05/2015  . Vitamin D deficiency 11/25/2014  . Palpitations 11/24/2014    Domingue Coltrain Nilda Simmer PT, MPH 07/12/2016, 3:27 PM  [facility] [place], [name], [address] Phone: [phone]   Fax:  [phone]  Name: Carla Watts MRN: [id number] Date of Birth: 06/12/1965  PHYSICAL THERAPY DISCHARGE SUMMARY  Visits from Start of Care: Eval only  Current functional level related to goals / functional outcomes: See evaluation    Remaining deficits: Unchanged    Education / Equipment: HEP  Plan: Patient agrees to discharge.  Patient goals were not met. Patient is being discharged due to                                                     ?????     Seen for evaluation only  Cruise Baumgardner P. Helene Kelp PT, MPH 07/31/16 4:16 PM

## 2016-07-19 DIAGNOSIS — M9901 Segmental and somatic dysfunction of cervical region: Secondary | ICD-10-CM | POA: Diagnosis not present

## 2016-07-19 DIAGNOSIS — M791 Myalgia: Secondary | ICD-10-CM | POA: Diagnosis not present

## 2016-07-19 DIAGNOSIS — M531 Cervicobrachial syndrome: Secondary | ICD-10-CM | POA: Diagnosis not present

## 2016-07-19 DIAGNOSIS — M9902 Segmental and somatic dysfunction of thoracic region: Secondary | ICD-10-CM | POA: Diagnosis not present

## 2016-08-21 DIAGNOSIS — M791 Myalgia: Secondary | ICD-10-CM | POA: Diagnosis not present

## 2016-08-21 DIAGNOSIS — M9902 Segmental and somatic dysfunction of thoracic region: Secondary | ICD-10-CM | POA: Diagnosis not present

## 2016-08-21 DIAGNOSIS — M9901 Segmental and somatic dysfunction of cervical region: Secondary | ICD-10-CM | POA: Diagnosis not present

## 2016-08-21 DIAGNOSIS — M531 Cervicobrachial syndrome: Secondary | ICD-10-CM | POA: Diagnosis not present

## 2016-09-18 DIAGNOSIS — M9901 Segmental and somatic dysfunction of cervical region: Secondary | ICD-10-CM | POA: Diagnosis not present

## 2016-09-18 DIAGNOSIS — M791 Myalgia: Secondary | ICD-10-CM | POA: Diagnosis not present

## 2016-09-18 DIAGNOSIS — M9902 Segmental and somatic dysfunction of thoracic region: Secondary | ICD-10-CM | POA: Diagnosis not present

## 2016-09-18 DIAGNOSIS — M531 Cervicobrachial syndrome: Secondary | ICD-10-CM | POA: Diagnosis not present

## 2016-11-06 DIAGNOSIS — M791 Myalgia: Secondary | ICD-10-CM | POA: Diagnosis not present

## 2016-11-06 DIAGNOSIS — M9902 Segmental and somatic dysfunction of thoracic region: Secondary | ICD-10-CM | POA: Diagnosis not present

## 2016-11-06 DIAGNOSIS — M9901 Segmental and somatic dysfunction of cervical region: Secondary | ICD-10-CM | POA: Diagnosis not present

## 2016-11-06 DIAGNOSIS — M531 Cervicobrachial syndrome: Secondary | ICD-10-CM | POA: Diagnosis not present

## 2016-11-20 ENCOUNTER — Encounter (INDEPENDENT_AMBULATORY_CARE_PROVIDER_SITE_OTHER): Payer: Self-pay

## 2016-12-18 ENCOUNTER — Ambulatory Visit (INDEPENDENT_AMBULATORY_CARE_PROVIDER_SITE_OTHER): Payer: BLUE CROSS/BLUE SHIELD | Admitting: Family Medicine

## 2016-12-18 ENCOUNTER — Encounter: Payer: Self-pay | Admitting: Family Medicine

## 2016-12-18 VITALS — BP 109/69 | HR 72 | Temp 98.0°F | Resp 18 | Ht 62.0 in | Wt 128.0 lb

## 2016-12-18 DIAGNOSIS — Z0000 Encounter for general adult medical examination without abnormal findings: Secondary | ICD-10-CM

## 2016-12-18 DIAGNOSIS — E559 Vitamin D deficiency, unspecified: Secondary | ICD-10-CM | POA: Diagnosis not present

## 2016-12-18 DIAGNOSIS — Z13220 Encounter for screening for lipoid disorders: Secondary | ICD-10-CM | POA: Diagnosis not present

## 2016-12-18 DIAGNOSIS — Z131 Encounter for screening for diabetes mellitus: Secondary | ICD-10-CM

## 2016-12-18 DIAGNOSIS — Z1231 Encounter for screening mammogram for malignant neoplasm of breast: Secondary | ICD-10-CM

## 2016-12-18 DIAGNOSIS — Z1502 Genetic susceptibility to malignant neoplasm of ovary: Secondary | ICD-10-CM

## 2016-12-18 DIAGNOSIS — Z23 Encounter for immunization: Secondary | ICD-10-CM

## 2016-12-18 DIAGNOSIS — Z1211 Encounter for screening for malignant neoplasm of colon: Secondary | ICD-10-CM

## 2016-12-18 DIAGNOSIS — Z130 Encounter for screening for diseases of the blood and blood-forming organs and certain disorders involving the immune mechanism: Secondary | ICD-10-CM

## 2016-12-18 DIAGNOSIS — Z1589 Genetic susceptibility to other disease: Secondary | ICD-10-CM | POA: Diagnosis not present

## 2016-12-18 DIAGNOSIS — Z1239 Encounter for other screening for malignant neoplasm of breast: Secondary | ICD-10-CM

## 2016-12-18 DIAGNOSIS — Z1509 Genetic susceptibility to other malignant neoplasm: Secondary | ICD-10-CM

## 2016-12-18 DIAGNOSIS — R002 Palpitations: Secondary | ICD-10-CM | POA: Diagnosis not present

## 2016-12-18 DIAGNOSIS — Z1501 Genetic susceptibility to malignant neoplasm of breast: Secondary | ICD-10-CM | POA: Diagnosis not present

## 2016-12-18 LAB — HEMOGLOBIN A1C: HEMOGLOBIN A1C: 5.5 % (ref 4.6–6.5)

## 2016-12-18 LAB — LIPID PANEL
CHOLESTEROL: 181 mg/dL (ref 0–200)
HDL: 88.1 mg/dL (ref >39.00)
LDL Cholesterol: 83 mg/dL (ref 0–99)
NonHDL: 92.75
Total CHOL/HDL Ratio: 2
Triglycerides: 47 mg/dL (ref 0.0–149.0)
VLDL: 9.4 mg/dL (ref 0.0–40.0)

## 2016-12-18 LAB — COMPREHENSIVE METABOLIC PANEL
ALT: 11 U/L (ref 0–35)
AST: 18 U/L (ref 0–37)
Albumin: 4.4 g/dL (ref 3.5–5.2)
Alkaline Phosphatase: 44 U/L (ref 39–117)
BUN: 11 mg/dL (ref 6–23)
CO2: 28 mEq/L (ref 19–32)
Calcium: 9.4 mg/dL (ref 8.4–10.5)
Chloride: 102 mEq/L (ref 96–112)
Creatinine, Ser: 0.67 mg/dL (ref 0.40–1.20)
GFR: 98.97 mL/min (ref >60.00)
Glucose, Bld: 92 mg/dL (ref 70–99)
POTASSIUM: 4.4 meq/L (ref 3.5–5.1)
SODIUM: 137 meq/L (ref 135–145)
TOTAL PROTEIN: 6.6 g/dL (ref 6.0–8.3)
Total Bilirubin: 0.6 mg/dL (ref 0.2–1.2)

## 2016-12-18 LAB — CBC WITH DIFFERENTIAL/PLATELET
Basophils Absolute: 0.1 10*3/uL (ref 0.0–0.1)
Basophils Relative: 1.3 % (ref 0.0–3.0)
EOS PCT: 0.7 % (ref 0.0–5.0)
Eosinophils Absolute: 0 10*3/uL (ref 0.0–0.7)
HEMATOCRIT: 40.1 % (ref 36.0–46.0)
HEMOGLOBIN: 13 g/dL (ref 12.0–15.0)
LYMPHS PCT: 29.9 % (ref 12.0–46.0)
Lymphs Abs: 2 10*3/uL (ref 0.7–4.0)
MCHC: 32.5 g/dL (ref 30.0–36.0)
MCV: 89.9 fl (ref 78.0–100.0)
MONOS PCT: 10.1 % (ref 3.0–12.0)
Monocytes Absolute: 0.7 10*3/uL (ref 0.1–1.0)
Neutro Abs: 3.8 10*3/uL (ref 1.4–7.7)
Neutrophils Relative %: 58 % (ref 43.0–77.0)
Platelets: 287 10*3/uL (ref 150.0–400.0)
RBC: 4.46 Mil/uL (ref 3.87–5.11)
RDW: 13.5 % (ref 11.5–15.5)
WBC: 6.6 10*3/uL (ref 4.0–10.5)

## 2016-12-18 LAB — TSH: TSH: 1 u[IU]/mL (ref 0.35–4.50)

## 2016-12-18 LAB — VITAMIN D 25 HYDROXY (VIT D DEFICIENCY, FRACTURES): VITD: 27.72 ng/mL — ABNORMAL LOW (ref 30.00–100.00)

## 2016-12-18 NOTE — Progress Notes (Signed)
Patient ID: Carla Watts, female  DOB: [birth year], 51 y.o.   MRN: [id number] Patient Care Team    Relationship Specialty Notifications Start End  Ma Hillock, DO PCP - General Family Medicine  11/24/14     Subjective:  Carla Watts is a 51 y.o.  Female  present for CPE. All past medical history, surgical history, allergies, family history, immunizations and social history was obtained from the patient today and entered into the electronic medical record.   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   Health maintenance: updated 12/18/2016 Colonoscopy: No fhx, recommend colonoscopy screen start this year (32) referral placed today  Mammogram: Last mammogram 02/07/2016, Birads 1. Completed at breast center.  Cervical cancer screening: Last Pap smear 11/2014, PCP, normal with negative HPV. Rpt 3 years. Does not do SBE routinely.  Immunizations: tdap 01/2016, flu shot completed today.  Infectious disease screening: HIV completed DEXA: N/A Assistive device: None Oxygen use: None Patient has a Dental home. Hospitalizations/ED visits: None  Immunization History  Administered Date(s) Administered  . Influenza-Unspecified 01/04/2016  . MMR 01/08/2016  . Tdap 11/24/2014, 01/04/2016     Past Medical History:  Diagnosis Date  . Chicken pox   . Family history of brain cancer   . Family history of breast cancer   . Family history of ovarian cancer   . Family history of prostate cancer   . GERD (gastroesophageal reflux disease)    Allergies  Allergen Reactions  . Meloxicam Rash  . Penicillins Rash   Past Surgical History:  Procedure Laterality Date  . CERVICAL BIOPSY  W/ LOOP ELECTRODE EXCISION    . CESAREAN SECTION    . laparoscopy of uterus    . OOPHORECTOMY Right 2002   dermoid cyst   Family History  Problem Relation Age of Onset  . Hypertension Mother   . Thyroid disease Mother   . Breast cancer Mother 6  . Skin cancer Mother   .  Stroke Father   . Skin cancer Father   . Heart attack Maternal Grandfather   . Heart disease Maternal Grandfather 41       Died of MI  . Arthritis Maternal Grandmother        RA  . Thyroid disease Maternal Grandmother   . Alcohol abuse Paternal Grandfather   . Ovarian cancer Paternal Aunt   . Brain cancer Paternal Uncle   . Thyroid disease Maternal Aunt    Social History   Social History  . Marital status: Married    Spouse name: N/A  . Number of children: N/A  . Years of education: N/A   Occupational History  . health fitness coach     yoga instructor   Social History Main Topics  . Smoking status: Never Smoker  . Smokeless tobacco: Never Used  . Alcohol use Yes     Comment: socially  . Drug use: No  . Sexual activity: Yes     Comment: husband with vasectomy   Other Topics Concern  . Not on file   Social History Narrative   Married, 3 children.   - drinks some caffeine.   - uses herbal remedies at time.    - Wears a seatbelt, exercises 3x/w, smoke alarm in the home   - Guns in the home, locked case   - Feels safe in relationship   Allergies as of 12/18/2016      Reactions   Meloxicam Rash   Penicillins  Rash      Medication List       Accurate as of 12/18/16  9:51 AM. Always use your most recent med list.          multivitamin capsule Take 1 capsule by mouth daily.   Vitamin D-3 1000 units Caps Take 1,000 Units by mouth.        No results found for this or any previous visit (from the past 2160 hour(s)).   ROS: 14 pt review of systems performed and negative (unless mentioned in an HPI)  Objective: BP 109/69 (BP Location: Right Arm, Patient Position: Sitting, Cuff Size: Normal)   Pulse 72   Temp 98 F (36.7 C)   Resp 18   Ht '5\' 2"'$  (1.575 m)   Wt 128 lb (58.1 kg)   LMP 12/09/2016   SpO2 98%   BMI 23.41 kg/m  Gen: Afebrile. No acute distress. Nontoxic in appearance, well developed, well nourished.  HENT: AT. [place]. Bilateral TM  visualized and normal in appearance. MMM. Bilateral nares with mild erythema, no swelling or drainage. Throat without erythema or exudates. No cough or hoarseness.  Eyes:Pupils Equal Round Reactive to light, Extraocular movements intact,  Conjunctiva without redness, discharge or icterus. Neck/lymp/endocrine: Supple,non lymphadenopathy, no thyromegaly CV: RRR no murmur, no edema, +2/4 P posterior tibialis pulses Chest: CTAB, no wheeze or crackles, good air movement.  Abd: Soft. flat. NTND. BS present. no Masses palpated.  Skin: no rashes, purpura or petechiae.  Neuro/MSK:  Normal gait. PERLA. EOMi. Alert. Orientedx3 Cranial nerves II through XII intact. Muscle strength 5/5 upper/lower extremity. DTRs equal bilaterally. Psych: Normal affect, dress and demeanor. Normal speech. Normal thought content and judgment.  Assessment/plan: [name] is a 51 y.o. female present for CPE.  Encounter for preventative adult health care examination Patient was encouraged to exercise greater than 150 minutes a week. Patient was encouraged to choose a diet filled with fresh fruits and vegetables, and lean meats. AVS provided to patient today for education/recommendation on gender specific health and safety maintenance. Palpitations - stable, continues to experience at times.  - Comp Met (CMET) - TSH Vitamin D deficiency - had been taking 1000u daily, but has not taken in awhile.  - Vitamin D (25 hydroxy) Screening for diabetes mellitus - HgB A1c Screening for iron deficiency anemia - CBC w/Diff Lipid screening - Lipid panel Influenza vaccine administered - Flu Vaccine QUAD 6+ mos PF IM (Fluarix Quad PF) Breast cancer screening, high risk patient-Chek2 mutation - Chek2 mutation and fhx. Discussed her genetic counseling recommendations provided by onc. She would like to proceed with routine yearly mammograms and will check with her insurance on Breast MRI coverage if she decides she is interested  -  MM SCREENING BREAST TOMO BILATERAL; Future Colon cancer screen- CHEK2 mutation - referral to GI for screen. recs from onc are every 5 years. No prior screen. * pt will check on insurance coverage, if she is interested in it for ovarian cancer CA125 screen and Ultrasound. This particular risk is not mentioned in the onc recs.    Return in about 1 year (around 12/18/2017) for CPE.  Electronically signed by: Howard Pouch, DO [place]

## 2016-12-18 NOTE — Patient Instructions (Addendum)
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  Health Maintenance, Female Adopting a healthy lifestyle and getting preventive care can go a long way to promote health and wellness. Talk with your health care provider about what schedule of regular examinations is right for you. This is a good chance for you to check in with your provider about disease prevention and staying healthy. In between checkups, there are plenty of things you can do on your own. Experts have done a lot of research about which lifestyle changes and preventive measures are most likely to keep you healthy. Ask your health care provider for more information. Weight and diet Eat a healthy diet  Be sure to include plenty of vegetables, fruits, low-fat dairy products, and lean protein.  Do not eat a lot of foods high in solid fats, added sugars, or salt.  Get regular exercise. This is one of the most important things you can do for your health. ? Most adults should exercise for at least 150 minutes each week. The exercise should increase your heart rate and make you sweat (moderate-intensity exercise). ? Most adults should also do strengthening exercises at least twice a week. This is in addition to the moderate-intensity exercise.  Maintain a healthy weight  Body mass index (BMI) is a measurement that can be used to identify possible weight problems. It estimates body fat based on height and weight. Your health care provider can help determine your BMI and help you achieve or maintain a healthy weight.  For females 60 years of age and older: ? A BMI below 18.5 is considered underweight. ? A BMI of 18.5 to 24.9 is normal. ? A BMI of 25 to 29.9 is considered overweight. ? A BMI of 30 and above is considered  obese.  Watch levels of cholesterol and blood lipids  You should start having your blood tested for lipids and cholesterol at 50 years of age, then have this test every 5 years.  You may need to have your cholesterol levels checked more often if: ? Your lipid or cholesterol levels are high. ? You are older than 50 years of age. ? You are at high risk for heart disease.  Cancer screening Lung Cancer  Lung cancer screening is recommended for adults 35-31 years old who are at high risk for lung cancer because of a history of smoking.  A yearly low-dose CT scan of the lungs is recommended for people who: ? Currently smoke. ? Have quit within the past 15 years. ? Have at least a 30-pack-year history of smoking. A pack year is smoking an average of one pack of cigarettes a day for 1 year.  Yearly screening should continue until it has been 15 years since you quit.  Yearly screening should stop if you develop a health problem that would prevent you from having lung cancer treatment.  Breast Cancer  Practice breast self-awareness. This means understanding how your breasts normally appear and feel.  It also means doing regular breast self-exams. Let your health care provider know about any changes, no matter how small.  If you are in your 20s or 30s, you should have a clinical breast exam (CBE) by a health care provider every 1-3 years as part of a regular health exam.  If you are 40  or older, have a CBE every year. Also consider having a breast X-ray (mammogram) every year.  If you have a family history of breast cancer, talk to your health care provider about genetic screening.  If you are at high risk for breast cancer, talk to your health care provider about having an MRI and a mammogram every year.  Breast cancer gene (BRCA) assessment is recommended for women who have family members with BRCA-related cancers. BRCA-related cancers  include: ? Breast. ? Ovarian. ? Tubal. ? Peritoneal cancers.  Results of the assessment will determine the need for genetic counseling and BRCA1 and BRCA2 testing.  Cervical Cancer Your health care provider may recommend that you be screened regularly for cancer of the pelvic organs (ovaries, uterus, and vagina). This screening involves a pelvic examination, including checking for microscopic changes to the surface of your cervix (Pap test). You may be encouraged to have this screening done every 3 years, beginning at age 21.  For women ages 30-65, health care providers may recommend pelvic exams and Pap testing every 3 years, or they may recommend the Pap and pelvic exam, combined with testing for human papilloma virus (HPV), every 5 years. Some types of HPV increase your risk of cervical cancer. Testing for HPV may also be done on women of any age with unclear Pap test results.  Other health care providers may not recommend any screening for nonpregnant women who are considered low risk for pelvic cancer and who do not have symptoms. Ask your health care provider if a screening pelvic exam is right for you.  If you have had past treatment for cervical cancer or a condition that could lead to cancer, you need Pap tests and screening for cancer for at least 20 years after your treatment. If Pap tests have been discontinued, your risk factors (such as having a new sexual partner) need to be reassessed to determine if screening should resume. Some women have medical problems that increase the chance of getting cervical cancer. In these cases, your health care provider may recommend more frequent screening and Pap tests.  Colorectal Cancer  This type of cancer can be detected and often prevented.  Routine colorectal cancer screening usually begins at 50 years of age and continues through 50 years of age.  Your health care provider may recommend screening at an earlier age if you have risk factors  for colon cancer.  Your health care provider may also recommend using home test kits to check for hidden blood in the stool.  A small camera at the end of a tube can be used to examine your colon directly (sigmoidoscopy or colonoscopy). This is done to check for the earliest forms of colorectal cancer.  Routine screening usually begins at age 50.  Direct examination of the colon should be repeated every 5-10 years through 50 years of age. However, you may need to be screened more often if early forms of precancerous polyps or small growths are found.  Skin Cancer  Check your skin from head to toe regularly.  Tell your health care provider about any new moles or changes in moles, especially if there is a change in a mole's shape or color.  Also tell your health care provider if you have a mole that is larger than the size of a pencil eraser.  Always use sunscreen. Apply sunscreen liberally and repeatedly throughout the day.  Protect yourself by wearing long sleeves, pants, a wide-brimmed hat, and sunglasses whenever you are   outside.  Heart disease, diabetes, and high blood pressure  High blood pressure causes heart disease and increases the risk of stroke. High blood pressure is more likely to develop in: ? People who have blood pressure in the high end of the normal range (130-139/85-89 mm Hg). ? People who are overweight or obese. ? People who are African American.  If you are 87-36 years of age, have your blood pressure checked every 3-5 years. If you are 52 years of age or older, have your blood pressure checked every year. You should have your blood pressure measured twice-once when you are at a hospital or clinic, and once when you are not at a hospital or clinic. Record the average of the two measurements. To check your blood pressure when you are not at a hospital or clinic, you can use: ? An automated blood pressure machine at a pharmacy. ? A home blood pressure monitor.  If  you are between 28 years and 36 years old, ask your health care provider if you should take aspirin to prevent strokes.  Have regular diabetes screenings. This involves taking a blood sample to check your fasting blood sugar level. ? If you are at a normal weight and have a low risk for diabetes, have this test once every three years after 50 years of age. ? If you are overweight and have a high risk for diabetes, consider being tested at a younger age or more often. Preventing infection Hepatitis B  If you have a higher risk for hepatitis B, you should be screened for this virus. You are considered at high risk for hepatitis B if: ? You were born in a country where hepatitis B is common. Ask your health care provider which countries are considered high risk. ? Your parents were born in a high-risk country, and you have not been immunized against hepatitis B (hepatitis B vaccine). ? You have HIV or AIDS. ? You use needles to inject street drugs. ? You live with someone who has hepatitis B. ? You have had sex with someone who has hepatitis B. ? You get hemodialysis treatment. ? You take certain medicines for conditions, including cancer, organ transplantation, and autoimmune conditions.  Hepatitis C  Blood testing is recommended for: ? Everyone born from [birth year] through 1964. ? Anyone with known risk factors for hepatitis C.  Sexually transmitted infections (STIs)  You should be screened for sexually transmitted infections (STIs) including gonorrhea and chlamydia if: ? You are sexually active and are younger than 50 years of age. ? You are older than 50 years of age and your health care provider tells you that you are at risk for this type of infection. ? Your sexual activity has changed since you were last screened and you are at an increased risk for chlamydia or gonorrhea. Ask your health care provider if you are at risk.  If you do not have HIV, but are at risk, it may be recommended  that you take a prescription medicine daily to prevent HIV infection. This is called pre-exposure prophylaxis (PrEP). You are considered at risk if: ? You are sexually active and do not regularly use condoms or know the HIV status of your partner(s). ? You take drugs by injection. ? You are sexually active with a partner who has HIV.  Talk with your health care provider about whether you are at high risk of being infected with HIV. If you choose to begin PrEP, you should first be tested  for HIV. You should then be tested every 3 months for as long as you are taking PrEP. Pregnancy  If you are premenopausal and you may become pregnant, ask your health care provider about preconception counseling.  If you may become pregnant, take 400 to 800 micrograms (mcg) of folic acid every day.  If you want to prevent pregnancy, talk to your health care provider about birth control (contraception). Osteoporosis and menopause  Osteoporosis is a disease in which the bones lose minerals and strength with aging. This can result in serious bone fractures. Your risk for osteoporosis can be identified using a bone density scan.  If you are 45 years of age or older, or if you are at risk for osteoporosis and fractures, ask your health care provider if you should be screened.  Ask your health care provider whether you should take a calcium or vitamin D supplement to lower your risk for osteoporosis.  Menopause may have certain physical symptoms and risks.  Hormone replacement therapy may reduce some of these symptoms and risks. Talk to your health care provider about whether hormone replacement therapy is right for you. Follow these instructions at home:  Schedule regular health, dental, and eye exams.  Stay current with your immunizations.  Do not use any tobacco products including cigarettes, chewing tobacco, or electronic cigarettes.  If you are pregnant, do not drink alcohol.  If you are  breastfeeding, limit how much and how often you drink alcohol.  Limit alcohol intake to no more than 1 drink per day for nonpregnant women. One drink equals 12 ounces of beer, 5 ounces of wine, or 1 ounces of hard liquor.  Do not use street drugs.  Do not share needles.  Ask your health care provider for help if you need support or information about quitting drugs.  Tell your health care provider if you often feel depressed.  Tell your health care provider if you have ever been abused or do not feel safe at home. This information is not intended to replace advice given to you by your health care provider. Make sure you discuss any questions you have with your health care provider. Document Released: 09/03/2010 Document Revised: 07/27/2015 Document Reviewed: 11/22/2014 Elsevier Interactive Patient Education  [employer].

## 2017-01-15 DIAGNOSIS — M791 Myalgia, unspecified site: Secondary | ICD-10-CM | POA: Diagnosis not present

## 2017-01-15 DIAGNOSIS — M9902 Segmental and somatic dysfunction of thoracic region: Secondary | ICD-10-CM | POA: Diagnosis not present

## 2017-01-15 DIAGNOSIS — M9901 Segmental and somatic dysfunction of cervical region: Secondary | ICD-10-CM | POA: Diagnosis not present

## 2017-01-15 DIAGNOSIS — M531 Cervicobrachial syndrome: Secondary | ICD-10-CM | POA: Diagnosis not present

## 2017-01-20 ENCOUNTER — Encounter: Payer: Self-pay | Admitting: Family Medicine

## 2017-02-12 DIAGNOSIS — M9901 Segmental and somatic dysfunction of cervical region: Secondary | ICD-10-CM | POA: Diagnosis not present

## 2017-02-12 DIAGNOSIS — M791 Myalgia, unspecified site: Secondary | ICD-10-CM | POA: Diagnosis not present

## 2017-02-12 DIAGNOSIS — M531 Cervicobrachial syndrome: Secondary | ICD-10-CM | POA: Diagnosis not present

## 2017-02-12 DIAGNOSIS — M9902 Segmental and somatic dysfunction of thoracic region: Secondary | ICD-10-CM | POA: Diagnosis not present

## 2017-04-25 ENCOUNTER — Encounter: Payer: Self-pay | Admitting: Family Medicine

## 2017-04-25 ENCOUNTER — Ambulatory Visit: Payer: BLUE CROSS/BLUE SHIELD | Admitting: Family Medicine

## 2017-04-25 VITALS — BP 115/63 | HR 83 | Temp 98.7°F | Ht 62.0 in | Wt 131.0 lb

## 2017-04-25 DIAGNOSIS — J0100 Acute maxillary sinusitis, unspecified: Secondary | ICD-10-CM | POA: Diagnosis not present

## 2017-04-25 MED ORDER — AZITHROMYCIN 250 MG PO TABS
ORAL_TABLET | ORAL | 0 refills | Status: DC
Start: 2017-04-25 — End: 2018-02-27

## 2017-04-25 NOTE — Progress Notes (Signed)
OFFICE VISIT  04/25/2017   CC:  Chief Complaint  Patient presents with  . Cough    head and chest congestion   HPI:    Patient is a 52 y.o. Caucasian female who presents for respiratory symptoms. Onset about 2 weeks ago, nasal congestion/sinus pressure, PND, then over the last week a much deeper cough.  Started to improved a little for a few days then "sinus" sx's returned worse than prior. Some pain in face.  Some upper teeth pain. Cough is not as deep last few days. No chest tightness, no SOB, no wheezing.  No fevers.  Past Medical History:  Diagnosis Date  . Chicken pox   . Family history of brain cancer   . Family history of breast cancer   . Family history of ovarian cancer   . Family history of prostate cancer   . GERD (gastroesophageal reflux disease)     Past Surgical History:  Procedure Laterality Date  . CERVICAL BIOPSY  W/ LOOP ELECTRODE EXCISION    . CESAREAN SECTION    . laparoscopy of uterus    . OOPHORECTOMY Right 2002   dermoid cyst    Outpatient Medications Prior to Visit  Medication Sig Dispense Refill  . Cholecalciferol (VITAMIN D-3) 1000 units CAPS Take 1,000 Units by mouth.    . Omega-3 Fatty Acids (FISH OIL ADULT GUMMIES PO) Take by mouth.    . Multiple Vitamin (MULTIVITAMIN) capsule Take 1 capsule by mouth daily.     No facility-administered medications prior to visit.     Allergies  Allergen Reactions  . Meloxicam Rash  . Penicillins Rash    ROS As per HPI  PE: Blood pressure 115/63, pulse 83, temperature 98.7 F (37.1 C), temperature source Oral, height 5\' 2"  (1.575 m), weight 131 lb (59.4 kg), SpO2 97 %. VS: noted--normal. Gen: alert, NAD, NONTOXIC APPEARING. HEENT: eyes without injection, drainage, or swelling.  Ears: EACs clear, TMs with normal light reflex and landmarks.  Nose: Clear rhinorrhea, with some dried, crusty exudate adherent to mildly injected mucosa.  No purulent d/c.  Mild bilat paranasal sinus TTP.  No facial  swelling.  Throat and mouth without focal lesion.  No pharyngial swelling, erythema, or exudate.   Neck: supple, no LAD.   LUNGS: CTA bilat, nonlabored resps.   CV: RRR, no m/r/g. EXT: no c/c/e SKIN: no rash   LABS:    Chemistry      Component Value Date/Time   NA 137 12/18/2016 0943   K 4.4 12/18/2016 0943   CL 102 12/18/2016 0943   CO2 28 12/18/2016 0943   BUN 11 12/18/2016 0943   CREATININE 0.67 12/18/2016 0943      Component Value Date/Time   CALCIUM 9.4 12/18/2016 0943   ALKPHOS 44 12/18/2016 0943   AST 18 12/18/2016 0943   ALT 11 12/18/2016 0943   BILITOT 0.6 12/18/2016 0943     Lab Results  Component Value Date   WBC 6.6 12/18/2016   HGB 13.0 12/18/2016   HCT 40.1 12/18/2016   MCV 89.9 12/18/2016   PLT 287.0 12/18/2016   IMPRESSION AND PLAN:  Acute sinusitis. Azith x 5d. Get otc generic robitussin DM OR Mucinex DM and use as directed on the packaging for cough and congestion. Use otc generic saline nasal spray 2-3 times per day to irrigate/moisturize your nasal passages.  An After Visit Summary was printed and given to the patient.  FOLLOW UP: Return if symptoms worsen or fail to improve.  Signed:  Santiago BumpersPhil Jenascia Bumpass, MD           04/25/2017

## 2017-04-25 NOTE — Patient Instructions (Signed)
Get otc generic robitussin DM OR Mucinex DM and use as directed on the packaging for cough and congestion. Use otc generic saline nasal spray 2-3 times per day to irrigate/moisturize your nasal passages.   

## 2017-05-26 ENCOUNTER — Telehealth: Payer: Self-pay | Admitting: *Deleted

## 2017-05-26 NOTE — Telephone Encounter (Signed)
Copied from CRM [id number]. Topic: Quick Communication - See Telephone Encounter >> May 21, 2017  4:27 PM Herby AbrahamJohnson, Shiquita C wrote: CRM for notification. See Telephone encounter for: pt called in because she need referral to have a colonoscopy. Pt says that when originally placed she wasn't ready for scheduling. ALSO, pt says that she remember provider stating that she need to check with insurance to make sure that they will cover US of breast, pt says that she's not sure but would like a call back to discuss.    CB: [phone]: [phone]    05/21/17.   Called and left patient a message referral is still good she can call [facility] to schedule.

## 2017-12-03 DIAGNOSIS — M9901 Segmental and somatic dysfunction of cervical region: Secondary | ICD-10-CM | POA: Diagnosis not present

## 2017-12-03 DIAGNOSIS — M791 Myalgia, unspecified site: Secondary | ICD-10-CM | POA: Diagnosis not present

## 2017-12-03 DIAGNOSIS — M9902 Segmental and somatic dysfunction of thoracic region: Secondary | ICD-10-CM | POA: Diagnosis not present

## 2017-12-03 DIAGNOSIS — M531 Cervicobrachial syndrome: Secondary | ICD-10-CM | POA: Diagnosis not present

## 2018-01-09 DIAGNOSIS — M9902 Segmental and somatic dysfunction of thoracic region: Secondary | ICD-10-CM | POA: Diagnosis not present

## 2018-01-09 DIAGNOSIS — M9901 Segmental and somatic dysfunction of cervical region: Secondary | ICD-10-CM | POA: Diagnosis not present

## 2018-01-09 DIAGNOSIS — M531 Cervicobrachial syndrome: Secondary | ICD-10-CM | POA: Diagnosis not present

## 2018-01-09 DIAGNOSIS — M791 Myalgia, unspecified site: Secondary | ICD-10-CM | POA: Diagnosis not present

## 2018-02-12 ENCOUNTER — Encounter: Payer: BLUE CROSS/BLUE SHIELD | Admitting: Family Medicine

## 2018-02-20 ENCOUNTER — Other Ambulatory Visit (HOSPITAL_BASED_OUTPATIENT_CLINIC_OR_DEPARTMENT_OTHER): Payer: Self-pay | Admitting: Family Medicine

## 2018-02-20 DIAGNOSIS — Z1231 Encounter for screening mammogram for malignant neoplasm of breast: Secondary | ICD-10-CM

## 2018-02-21 ENCOUNTER — Ambulatory Visit (HOSPITAL_BASED_OUTPATIENT_CLINIC_OR_DEPARTMENT_OTHER)
Admission: RE | Admit: 2018-02-21 | Discharge: 2018-02-21 | Disposition: A | Discharge status: Home / Self Care | Payer: BLUE CROSS/BLUE SHIELD | Source: Ambulatory Visit | Attending: Family Medicine | Admitting: Family Medicine

## 2018-02-21 DIAGNOSIS — Z1231 Encounter for screening mammogram for malignant neoplasm of breast: Secondary | ICD-10-CM | POA: Diagnosis not present

## 2018-02-23 ENCOUNTER — Encounter: Payer: Self-pay | Admitting: *Deleted

## 2018-02-27 ENCOUNTER — Ambulatory Visit (INDEPENDENT_AMBULATORY_CARE_PROVIDER_SITE_OTHER): Payer: BLUE CROSS/BLUE SHIELD | Admitting: Family Medicine

## 2018-02-27 ENCOUNTER — Encounter: Payer: Self-pay | Admitting: Family Medicine

## 2018-02-27 VITALS — BP 111/77 | HR 73 | Temp 98.8°F | Resp 16 | Ht 63.0 in | Wt 133.0 lb

## 2018-02-27 DIAGNOSIS — Z13220 Encounter for screening for lipoid disorders: Secondary | ICD-10-CM

## 2018-02-27 DIAGNOSIS — E559 Vitamin D deficiency, unspecified: Secondary | ICD-10-CM | POA: Diagnosis not present

## 2018-02-27 DIAGNOSIS — N938 Other specified abnormal uterine and vaginal bleeding: Secondary | ICD-10-CM

## 2018-02-27 DIAGNOSIS — Z130 Encounter for screening for diseases of the blood and blood-forming organs and certain disorders involving the immune mechanism: Secondary | ICD-10-CM

## 2018-02-27 DIAGNOSIS — Z299 Encounter for prophylactic measures, unspecified: Secondary | ICD-10-CM

## 2018-02-27 DIAGNOSIS — Z1211 Encounter for screening for malignant neoplasm of colon: Secondary | ICD-10-CM

## 2018-02-27 DIAGNOSIS — Z23 Encounter for immunization: Secondary | ICD-10-CM | POA: Diagnosis not present

## 2018-02-27 DIAGNOSIS — R002 Palpitations: Secondary | ICD-10-CM | POA: Diagnosis not present

## 2018-02-27 LAB — LIPID PANEL
CHOLESTEROL: 183 mg/dL (ref 0–200)
HDL: 95.6 mg/dL (ref >39.00)
LDL CALC: 76 mg/dL (ref 0–99)
NonHDL: 87.48
TRIGLYCERIDES: 58 mg/dL (ref 0.0–149.0)
Total CHOL/HDL Ratio: 2
VLDL: 11.6 mg/dL (ref 0.0–40.0)

## 2018-02-27 LAB — BASIC METABOLIC PANEL
BUN: 14 mg/dL (ref 6–23)
CO2: 27 meq/L (ref 19–32)
Calcium: 8.9 mg/dL (ref 8.4–10.5)
Chloride: 104 mEq/L (ref 96–112)
Creatinine, Ser: 0.7 mg/dL (ref 0.40–1.20)
GFR: 93.65 mL/min (ref >60.00)
GLUCOSE: 84 mg/dL (ref 70–99)
POTASSIUM: 4.3 meq/L (ref 3.5–5.1)
SODIUM: 139 meq/L (ref 135–145)

## 2018-02-27 LAB — CBC
HCT: 39.6 % (ref 36.0–46.0)
Hemoglobin: 13.1 g/dL (ref 12.0–15.0)
MCHC: 33.2 g/dL (ref 30.0–36.0)
MCV: 88.8 fl (ref 78.0–100.0)
Platelets: 263 10*3/uL (ref 150.0–400.0)
RBC: 4.46 Mil/uL (ref 3.87–5.11)
RDW: 13.4 % (ref 11.5–15.5)
WBC: 6.6 10*3/uL (ref 4.0–10.5)

## 2018-02-27 LAB — VITAMIN D 25 HYDROXY (VIT D DEFICIENCY, FRACTURES): VITD: 33.78 ng/mL (ref 30.00–100.00)

## 2018-02-27 LAB — TSH: TSH: 1.77 u[IU]/mL (ref 0.35–4.50)

## 2018-02-27 LAB — HEMOGLOBIN A1C: HEMOGLOBIN A1C: 5.4 % (ref 4.6–6.5)

## 2018-02-27 NOTE — Patient Instructions (Addendum)
Referrals: gynecology and gastroenterology Nurse isit in 3 mos for shingrix #2  Health Maintenance, Female Adopting a healthy lifestyle and getting preventive care can go a long way to promote health and wellness. Talk with your health care provider about what schedule of regular examinations is right for you. This is a good chance for you to check in with your provider about disease prevention and staying healthy. In between checkups, there are plenty of things you can do on your own. Experts have done a lot of research about which lifestyle changes and preventive measures are most likely to keep you healthy. Ask your health care provider for more information. Weight and diet Eat a healthy diet  Be sure to include plenty of vegetables, fruits, low-fat dairy products, and lean protein.  Do not eat a lot of foods high in solid fats, added sugars, or salt.  Get regular exercise. This is one of the most important things you can do for your health. ? Most adults should exercise for at least 150 minutes each week. The exercise should increase your heart rate and make you sweat (moderate-intensity exercise). ? Most adults should also do strengthening exercises at least twice a week. This is in addition to the moderate-intensity exercise. Maintain a healthy weight  Body mass index (BMI) is a measurement that can be used to identify possible weight problems. It estimates body fat based on height and weight. Your health care provider can help determine your BMI and help you achieve or maintain a healthy weight.  For females 34 years of age and older: ? A BMI below 18.5 is considered underweight. ? A BMI of 18.5 to 24.9 is normal. ? A BMI of 25 to 29.9 is considered overweight. ? A BMI of 30 and above is considered obese. Watch levels of cholesterol and blood lipids  You should start having your blood tested for lipids and cholesterol at 51 years of age, then have this test every 5 years.  You may  need to have your cholesterol levels checked more often if: ? Your lipid or cholesterol levels are high. ? You are older than 51 years of age. ? You are at high risk for heart disease. Cancer screening Lung Cancer  Lung cancer screening is recommended for adults 72-68 years old who are at high risk for lung cancer because of a history of smoking.  A yearly low-dose CT scan of the lungs is recommended for people who: ? Currently smoke. ? Have quit within the past 15 years. ? Have at least a 30-pack-year history of smoking. A pack year is smoking an average of one pack of cigarettes a day for 1 year.  Yearly screening should continue until it has been 15 years since you quit.  Yearly screening should stop if you develop a health problem that would prevent you from having lung cancer treatment. Breast Cancer  Practice breast self-awareness. This means understanding how your breasts normally appear and feel.  It also means doing regular breast self-exams. Let your health care provider know about any changes, no matter how small.  If you are in your 20s or 30s, you should have a clinical breast exam (CBE) by a health care provider every 1-3 years as part of a regular health exam.  If you are 28 or older, have a CBE every year. Also consider having a breast X-ray (mammogram) every year.  If you have a family history of breast cancer, talk to your health care provider about  genetic screening.  If you are at high risk for breast cancer, talk to your health care provider about having an MRI and a mammogram every year.  Breast cancer gene (BRCA) assessment is recommended for women who have family members with BRCA-related cancers. BRCA-related cancers include: ? Breast. ? Ovarian. ? Tubal. ? Peritoneal cancers.  Results of the assessment will determine the need for genetic counseling and BRCA1 and BRCA2 testing. Cervical Cancer Your health care provider may recommend that you be screened  regularly for cancer of the pelvic organs (ovaries, uterus, and vagina). This screening involves a pelvic examination, including checking for microscopic changes to the surface of your cervix (Pap test). You may be encouraged to have this screening done every 3 years, beginning at age 64.  For women ages 66-65, health care providers may recommend pelvic exams and Pap testing every 3 years, or they may recommend the Pap and pelvic exam, combined with testing for human papilloma virus (HPV), every 5 years. Some types of HPV increase your risk of cervical cancer. Testing for HPV may also be done on women of any age with unclear Pap test results.  Other health care providers may not recommend any screening for nonpregnant women who are considered low risk for pelvic cancer and who do not have symptoms. Ask your health care provider if a screening pelvic exam is right for you.  If you have had past treatment for cervical cancer or a condition that could lead to cancer, you need Pap tests and screening for cancer for at least 20 years after your treatment. If Pap tests have been discontinued, your risk factors (such as having a new sexual partner) need to be reassessed to determine if screening should resume. Some women have medical problems that increase the chance of getting cervical cancer. In these cases, your health care provider may recommend more frequent screening and Pap tests. Colorectal Cancer  This type of cancer can be detected and often prevented.  Routine colorectal cancer screening usually begins at 51 years of age and continues through 51 years of age.  Your health care provider may recommend screening at an earlier age if you have risk factors for colon cancer.  Your health care provider may also recommend using home test kits to check for hidden blood in the stool.  A small camera at the end of a tube can be used to examine your colon directly (sigmoidoscopy or colonoscopy). This is  done to check for the earliest forms of colorectal cancer.  Routine screening usually begins at age 73.  Direct examination of the colon should be repeated every 5-10 years through 51 years of age. However, you may need to be screened more often if early forms of precancerous polyps or small growths are found. Skin Cancer  Check your skin from head to toe regularly.  Tell your health care provider about any new moles or changes in moles, especially if there is a change in a mole's shape or color.  Also tell your health care provider if you have a mole that is larger than the size of a pencil eraser.  Always use sunscreen. Apply sunscreen liberally and repeatedly throughout the day.  Protect yourself by wearing long sleeves, pants, a wide-brimmed hat, and sunglasses whenever you are outside. Heart disease, diabetes, and high blood pressure  High blood pressure causes heart disease and increases the risk of stroke. High blood pressure is more likely to develop in: ? People who have blood pressure  in the high end of the normal range (130-139/85-89 mm Hg). ? People who are overweight or obese. ? People who are African American.  If you are 5-13 years of age, have your blood pressure checked every 3-5 years. If you are 20 years of age or older, have your blood pressure checked every year. You should have your blood pressure measured twice-once when you are at a hospital or clinic, and once when you are not at a hospital or clinic. Record the average of the two measurements. To check your blood pressure when you are not at a hospital or clinic, you can use: ? An automated blood pressure machine at a pharmacy. ? A home blood pressure monitor.  If you are between 79 years and 41 years old, ask your health care provider if you should take aspirin to prevent strokes.  Have regular diabetes screenings. This involves taking a blood sample to check your fasting blood sugar level. ? If you are at a  normal weight and have a low risk for diabetes, have this test once every three years after 51 years of age. ? If you are overweight and have a high risk for diabetes, consider being tested at a younger age or more often. Preventing infection Hepatitis B  If you have a higher risk for hepatitis B, you should be screened for this virus. You are considered at high risk for hepatitis B if: ? You were born in a country where hepatitis B is common. Ask your health care provider which countries are considered high risk. ? Your parents were born in a high-risk country, and you have not been immunized against hepatitis B (hepatitis B vaccine). ? You have HIV or AIDS. ? You use needles to inject street drugs. ? You live with someone who has hepatitis B. ? You have had sex with someone who has hepatitis B. ? You get hemodialysis treatment. ? You take certain medicines for conditions, including cancer, organ transplantation, and autoimmune conditions. Hepatitis C  Blood testing is recommended for: ? Everyone born from [birth year] through 1964. ? Anyone with known risk factors for hepatitis C. Sexually transmitted infections (STIs)  You should be screened for sexually transmitted infections (STIs) including gonorrhea and chlamydia if: ? You are sexually active and are younger than 51 years of age. ? You are older than 51 years of age and your health care provider tells you that you are at risk for this type of infection. ? Your sexual activity has changed since you were last screened and you are at an increased risk for chlamydia or gonorrhea. Ask your health care provider if you are at risk.  If you do not have HIV, but are at risk, it may be recommended that you take a prescription medicine daily to prevent HIV infection. This is called pre-exposure prophylaxis (PrEP). You are considered at risk if: ? You are sexually active and do not regularly use condoms or know the HIV status of your partner(s). ? You  take drugs by injection. ? You are sexually active with a partner who has HIV. Talk with your health care provider about whether you are at high risk of being infected with HIV. If you choose to begin PrEP, you should first be tested for HIV. You should then be tested every 3 months for as long as you are taking PrEP. Pregnancy  If you are premenopausal and you may become pregnant, ask your health care provider about preconception counseling.  If you  may become pregnant, take 400 to 800 micrograms (mcg) of folic acid every day.  If you want to prevent pregnancy, talk to your health care provider about birth control (contraception). Osteoporosis and menopause  Osteoporosis is a disease in which the bones lose minerals and strength with aging. This can result in serious bone fractures. Your risk for osteoporosis can be identified using a bone density scan.  If you are 78 years of age or older, or if you are at risk for osteoporosis and fractures, ask your health care provider if you should be screened.  Ask your health care provider whether you should take a calcium or vitamin D supplement to lower your risk for osteoporosis.  Menopause may have certain physical symptoms and risks.  Hormone replacement therapy may reduce some of these symptoms and risks. Talk to your health care provider about whether hormone replacement therapy is right for you. Follow these instructions at home:  Schedule regular health, dental, and eye exams.  Stay current with your immunizations.  Do not use any tobacco products including cigarettes, chewing tobacco, or electronic cigarettes.  If you are pregnant, do not drink alcohol.  If you are breastfeeding, limit how much and how often you drink alcohol.  Limit alcohol intake to no more than 1 drink per day for nonpregnant women. One drink equals 12 ounces of beer, 5 ounces of wine, or 1 ounces of hard liquor.  Do not use street drugs.  Do not share  needles.  Ask your health care provider for help if you need support or information about quitting drugs.  Tell your health care provider if you often feel depressed.  Tell your health care provider if you have ever been abused or do not feel safe at home. This information is not intended to replace advice given to you by your health care provider. Make sure you discuss any questions you have with your health care provider. Document Released: 09/03/2010 Document Revised: 07/27/2015 Document Reviewed: 11/22/2014 Elsevier Interactive Patient Education  2019 [employer].

## 2018-02-27 NOTE — Addendum Note (Signed)
Addended by: Eulah PontALBRIGHT, Vail Vuncannon M on: 02/27/2018 09:31 AM   Modules accepted: Orders

## 2018-02-27 NOTE — Progress Notes (Signed)
Patient ID: Carla Watts, female  DOB: Feb 24, 1966, 51 y.o.   MRN: [id number] Patient Care Team    Relationship Specialty Notifications Start End  Ma Hillock, DO PCP - General Family Medicine  11/24/14     Chief Complaint  Patient presents with  . Annual Exam    Pt is Fasting.    Subjective:  Carla Watts is a 52 y.o.  Female  present for CPE. All past medical history, surgical history, allergies, family history, immunizations, medications and social history were updated in the electronic medical record today. All recent labs, ED visits and hospitalizations within the last year were reviewed.  PHQ/GAd: discussed positive PHQ9 and GAD. She states she is ok. She just has had many changes this past year but coping ok.- declined intervention.    DUB: pt reports cycles every 14- 23 days, lasting about 4-5 days. She has a fhx ovarian cancer and has seen genetic counseling.   Health maintenance: updated 02/27/18 Colonoscopy: No fhx, routine screening to start --> referral made to GI again, she did not schedule last year Mammogram: Last mammogram 02/2018, Birads 1 Completed at breast center.  Cervical cancer screening: Last Pap smear 11/2014, PCP, normal with negative HPV. Rpt 3-5 years. Does not do SBE routinely.  Immunizations: tdap 01/2016, flu shot 12/2017, shingrix #1 today Infectious disease screening: HIV completed with last pregnancy, negative. DEXA: N/A Assistive device: none Oxygen use: none Patient has a Dental home. Hospitalizations/ED visits: reviewed  Depression screen [facility] 2/9 02/27/2018 12/18/2016 01/12/2016  Decreased Interest 0 0 0  Down, Depressed, Hopeless 1 0 0  PHQ - 2 Score 1 0 0  Altered sleeping 1 - -  Tired, decreased energy 1 - -  Change in appetite 2 - -  Feeling bad or failure about yourself  0 - -  Trouble concentrating 0 - -  Moving slowly or fidgety/restless 0 - -  Suicidal thoughts 0 - -  PHQ-9 Score 5 - -  Difficult doing work/chores  Not difficult at all - -   GAD 7 : Generalized Anxiety Score 02/27/2018  Nervous, Anxious, on Edge 1  Control/stop worrying 1  Worry too much - different things 1  Trouble relaxing 0  Restless 0  Easily annoyed or irritable 1  Afraid - awful might happen 1  Total GAD 7 Score 5  Anxiety Difficulty Somewhat difficult    Immunization History  Administered Date(s) Administered  . Influenza,inj,Quad PF,6+ Mos 12/18/2016  . Influenza-Unspecified 01/04/2016, 12/02/2017  . MMR 01/08/2016  . Tdap 11/24/2014, 01/04/2016    Past Medical History:  Diagnosis Date  . Chicken pox   . Family history of brain cancer   . Family history of breast cancer   . Family history of ovarian cancer   . Family history of prostate cancer   . GERD (gastroesophageal reflux disease)    Allergies  Allergen Reactions  . Meloxicam Rash  . Penicillins Rash   Past Surgical History:  Procedure Laterality Date  . CERVICAL BIOPSY  W/ LOOP ELECTRODE EXCISION    . CESAREAN SECTION    . laparoscopy of uterus    . OOPHORECTOMY Right 2002   dermoid cyst   Family History  Problem Relation Age of Onset  . Hypertension Mother   . Thyroid disease Mother   . Breast cancer Mother 46  . Skin cancer Mother   . Stroke Father   . Skin cancer Father   . Heart attack Maternal Grandfather   .  Heart disease Maternal Grandfather 52       Died of MI  . Arthritis Maternal Grandmother        RA  . Thyroid disease Maternal Grandmother   . Alcohol abuse Paternal Grandfather   . Ovarian cancer Paternal Aunt   . Brain cancer Paternal Uncle   . Thyroid disease Maternal Aunt    Social History   Socioeconomic History  . Marital status: Married    Spouse name: Not on file  . Number of children: Not on file  . Years of education: Not on file  . Highest education level: Not on file  Occupational History  . Occupation: Educational psychologist    Comment: yoga instructor  Social Needs  . Financial resource strain: Not  on file  . Food insecurity:    Worry: Not on file    Inability: Not on file  . Transportation needs:    Medical: Not on file    Non-medical: Not on file  Tobacco Use  . Smoking status: Never Smoker  . Smokeless tobacco: Never Used  Substance and Sexual Activity  . Alcohol use: Yes    Comment: socially  . Drug use: No  . Sexual activity: Yes    Comment: husband with vasectomy  Lifestyle  . Physical activity:    Days per week: Not on file    Minutes per session: Not on file  . Stress: Not on file  Relationships  . Social connections:    Talks on phone: Not on file    Gets together: Not on file    Attends religious service: Not on file    Active member of club or organization: Not on file    Attends meetings of clubs or organizations: Not on file    Relationship status: Not on file  . Intimate partner violence:    Fear of current or ex partner: Not on file    Emotionally abused: Not on file    Physically abused: Not on file    Forced sexual activity: Not on file  Other Topics Concern  . Not on file  Social History Narrative   Married, 3 children.   - drinks some caffeine.   - uses herbal remedies at time.    - Wears a seatbelt, exercises 3x/w, smoke alarm in the home   - Guns in the home, locked case   - Feels safe in relationship   Allergies as of 02/27/2018      Reactions   Meloxicam Rash   Penicillins Rash      Medication List       Accurate as of February 27, 2018  8:46 AM. Always use your most recent med list.        FISH OIL ADULT GUMMIES PO Take by mouth.   multivitamin capsule Take 1 capsule by mouth daily.   Vitamin D-3 25 MCG (1000 UT) Caps Take 1,000 Units by mouth.       All past medical history, surgical history, allergies, family history, immunizations andmedications were updated in the EMR today and reviewed under the history and medication portions of their EMR.     No results found for this or any previous visit (from the past 2160  hour(s)).  Mm 3d Screen Breast Bilateral Result Date: 02/23/2018 IMPRESSION: No mammographic evidence of malignancy. A result letter of this screening mammogram will be mailed directly to the patient. RECOMMENDATION: Screening mammogram in one year. (Code:SM-B-01Y) BI-RADS CATEGORY  1: Negative. Electronically Signed  By: Lajean Manes M.D.   On: 02/23/2018 13:17   ROS: 14 pt review of systems performed and negative (unless mentioned in an HPI)  Objective: BP 111/77 (BP Location: Left Arm, Patient Position: Sitting, Cuff Size: Normal)   Pulse 73   Temp 98.8 F (37.1 C) (Oral)   Resp 16   Ht '5\' 3"'$  (1.6 m)   Wt 133 lb (60.3 kg)   LMP 02/03/2018   SpO2 98%   BMI 23.56 kg/m  Gen: Afebrile. No acute distress. Nontoxic in appearance, well-developed, well-nourished,  Pleasant caucasian female.  HENT: AT. [place]. Bilateral TM visualized and normal in appearance, normal external auditory canal. MMM, no oral lesions, adequate dentition. Bilateral nares within normal limits. Throat without erythema, ulcerations or exudates. no Cough on exam, no hoarseness on exam. Eyes:Pupils Equal Round Reactive to light, Extraocular movements intact,  Conjunctiva without redness, discharge or icterus. Neck/lymp/endocrine: Supple,no lymphadenopathy, no thyromegaly CV: RRR no murmur, no edema, +2/4 P posterior tibialis pulses. no carotid bruits. No JVD. Chest: CTAB, no wheeze, rhonchi or crackles. normal Respiratory effort. good Air movement. Abd: Soft. flat. NTND. BS present. no Masses palpated. No hepatosplenomegaly. No rebound tenderness or guarding. Skin: no rashes, purpura or petechiae. Warm and well-perfused. Skin intact. Neuro/Msk:  Normal gait. PERLA. EOMi. Alert. Oriented x3.  Cranial nerves II through XII intact. Muscle strength 5/5 upper/lower extremity. DTRs equal bilaterally. Psych: Normal affect, dress and demeanor. Normal speech. Normal thought content and judgment.   No exam data  present  Assessment/plan: Affie Gasner is a 52 y.o. female present for CPE. 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 Discussed her genetic counseling recommendations provided by onc. 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 recs from onc are every 5 years. No prior screen. * pt will check on insurance coverage, if she is interested in it for ovarian cancer CA125 screen and Ultrasound. This particular risk is not mentioned in the onc recs. Encounter for preventive measure Patient was encouraged to exercise greater than 150 minutes a week. Patient was encouraged to choose a diet filled with fresh fruits and vegetables, and lean meats. AVS provided to patient today for education/recommendation on gender specific health and safety maintenance. Colonoscopy: No fhx, routine screening to start --> referral made to GI again, she did not schedule last year Mammogram: Last mammogram 02/2018, Birads 1 Completed at breast center.  Cervical cancer screening: Last Pap smear 11/2014, PCP, normal with negative HPV. Rpt 3-5 years. Does not do SBE routinely.  Immunizations: tdap 01/2016, flu shot 12/2017, shingrix #1 today--> nurse visit 3 mos for #2 Infectious disease screening: HIV completed with last pregnancy, negative. DEXA: N/A  Return in about 1 year (around 02/28/2019) for CPE.  Electronically signed by: Howard Pouch, DO [place]

## 2018-02-27 NOTE — Progress Notes (Signed)
[id number]

## 2018-04-13 ENCOUNTER — Encounter: Payer: Self-pay | Admitting: Family Medicine

## 2018-04-20 ENCOUNTER — Encounter: Payer: Self-pay | Admitting: Family Medicine

## 2018-04-21 ENCOUNTER — Encounter: Payer: Self-pay | Admitting: Family Medicine

## 2018-05-29 ENCOUNTER — Ambulatory Visit: Payer: Self-pay | Admitting: Obstetrics & Gynecology

## 2018-07-07 ENCOUNTER — Ambulatory Visit: Payer: BLUE CROSS/BLUE SHIELD | Admitting: Obstetrics & Gynecology

## 2018-07-09 ENCOUNTER — Other Ambulatory Visit: Payer: Self-pay

## 2018-07-13 ENCOUNTER — Encounter: Payer: Self-pay | Admitting: Obstetrics & Gynecology

## 2018-07-13 ENCOUNTER — Ambulatory Visit: Payer: BLUE CROSS/BLUE SHIELD | Admitting: Obstetrics & Gynecology

## 2018-07-13 ENCOUNTER — Other Ambulatory Visit: Payer: Self-pay

## 2018-07-13 VITALS — BP 116/76 | Ht 63.0 in | Wt 130.0 lb

## 2018-07-13 DIAGNOSIS — R87618 Other abnormal cytological findings on specimens from cervix uteri: Secondary | ICD-10-CM | POA: Diagnosis not present

## 2018-07-13 DIAGNOSIS — Z01419 Encounter for gynecological examination (general) (routine) without abnormal findings: Secondary | ICD-10-CM | POA: Diagnosis not present

## 2018-07-13 DIAGNOSIS — N920 Excessive and frequent menstruation with regular cycle: Secondary | ICD-10-CM

## 2018-07-13 DIAGNOSIS — Z9189 Other specified personal risk factors, not elsewhere classified: Secondary | ICD-10-CM | POA: Diagnosis not present

## 2018-07-13 NOTE — Progress Notes (Signed)
Carla Watts 11/11/1965 [id number]   History:    53 y.o. G5P3A2L3 Married.  Husband vasectomized  RP:  New patient presenting for annual gyn exam   HPI: S/P Rt Oophorectomy for Dermoid in 2002.  Menses regular with normal flow every 21 days in the last few months.  Had occasional light bleeding at 2 weeks, midcycle, about 6 months ago.  No pelvic pain currently, occasional Right pelvic pain.  Normal vaginal secretions.  No pain with IC.  Vasectomy.  Urine/BMs normal.  Breasts normal.  BMI 23.03.  Healthy nutrition, good fitness.  Health labs with Fam MD.  Past medical history,surgical history, family history and social history were all reviewed and documented in the EPIC chart.  Gynecologic History Patient's last menstrual period was 07/04/2018. Contraception: vasectomy Last Pap: 11/2014. Results were: Negative/HPV HR neg Last mammogram: 02/2018. Results were: Negative Bone Density: Never Colonoscopy: Never  Obstetric History OB History  Gravida Para Term Preterm AB Living  5 3     2 3   SAB TAB Ectopic Multiple Live Births  2            # Outcome Date GA Lbr Len/2nd Weight Sex Delivery Anes PTL Lv  5 SAB           4 SAB           3 Para           2 Para           1 Para              ROS: A ROS was performed and pertinent positives and negatives are included in the history.  GENERAL: No fevers or chills. HEENT: No change in vision, no earache, sore throat or sinus congestion. NECK: No pain or stiffness. CARDIOVASCULAR: No chest pain or pressure. No palpitations. PULMONARY: No shortness of breath, cough or wheeze. GASTROINTESTINAL: No abdominal pain, nausea, vomiting or diarrhea, melena or bright red blood per rectum. GENITOURINARY: No urinary frequency, urgency, hesitancy or dysuria. MUSCULOSKELETAL: No joint or muscle pain, no back pain, no recent trauma. DERMATOLOGIC: No rash, no itching, no lesions. ENDOCRINE: No polyuria, polydipsia, no heat or cold intolerance. No recent  change in weight. HEMATOLOGICAL: No anemia or easy bruising or bleeding. NEUROLOGIC: No headache, seizures, numbness, tingling or weakness. PSYCHIATRIC: No depression, no loss of interest in normal activity or change in sleep pattern.     Exam:   BP 116/76   Ht 5\' 3"  (1.6 m)   Wt 130 lb (59 kg)   LMP 07/04/2018   BMI 23.03 kg/m   Body mass index is 23.03 kg/m.  General appearance : Well developed well nourished female. No acute distress HEENT: Eyes: no retinal hemorrhage or exudates,  Neck supple, trachea midline, no carotid bruits, no thyroidmegaly Lungs: Clear to auscultation, no rhonchi or wheezes, or rib retractions  Heart: Regular rate and rhythm, no murmurs or gallops Breast:Examined in sitting and supine position were symmetrical in appearance, no palpable masses or tenderness,  no skin retraction, no nipple inversion, no nipple discharge, no skin discoloration, no axillary or supraclavicular lymphadenopathy Abdomen: no palpable masses or tenderness, no rebound or guarding Extremities: no edema or skin discoloration or tenderness  Pelvic: Vulva: Normal             Vagina: No gross lesions or discharge  Cervix: No gross lesions or discharge.  Pap reflex done  Uterus  AV, normal size, shape and consistency, non-tender and mobile  Adnexa  Without masses or tenderness  Anus: Normal   Assessment/Plan:  53 y.o. female for annual exam   1. Encounter for routine gynecological examination with Papanicolaou smear of cervix Normal gynecologic exam.  Pap reflex done.  Breast exam normal.  Screening mammogram December 2019 was negative.  Good body mass index at 23.03.  Continue with fitness and healthy nutrition.  Health labs with family physician.  Will schedule screening colonoscopy through family physician.  2. Relies on partner vasectomy for contraception  3. Short menstrual cycle Menstrual cycle short but within normal limits every 21 days with light flow.  No recent  breakthrough bleeding.  Normal gynecologic exam.  Decision to observe, patient will call for reevaluation with a pelvic ultrasound if develops any irregular or heavy bleeding.  Counseling on above issues and coordination of care >50% x 10 minutes.  Genia Del MD, 8:26 AM 07/13/2018

## 2018-07-13 NOTE — Addendum Note (Signed)
Addended by: Berna Spare A on: 07/13/2018 02:56 PM   Modules accepted: Orders

## 2018-07-13 NOTE — Patient Instructions (Signed)
1. Encounter for routine gynecological examination with Papanicolaou smear of cervix Normal gynecologic exam.  Pap reflex done.  Breast exam normal.  Screening mammogram December 2019 was negative.  Good body mass index at 23.03.  Continue with fitness and healthy nutrition.  Health labs with family physician.  Will schedule screening colonoscopy through family physician.  2. Relies on partner vasectomy for contraception  3. Short menstrual cycle Menstrual cycle short but within normal limits every 21 days with light flow.  No recent breakthrough bleeding.  Normal gynecologic exam.  Decision to observe, patient will call for reevaluation with a pelvic ultrasound if develops any irregular or heavy bleeding.  Carla Watts, it was a pleasure meeting you today!  I will inform you of your results as soon as they are available.

## 2018-07-14 LAB — PAP IG W/ RFLX HPV ASCU

## 2018-12-04 DIAGNOSIS — M9902 Segmental and somatic dysfunction of thoracic region: Secondary | ICD-10-CM | POA: Diagnosis not present

## 2018-12-04 DIAGNOSIS — M9901 Segmental and somatic dysfunction of cervical region: Secondary | ICD-10-CM | POA: Diagnosis not present

## 2018-12-04 DIAGNOSIS — M791 Myalgia, unspecified site: Secondary | ICD-10-CM | POA: Diagnosis not present

## 2018-12-04 DIAGNOSIS — M531 Cervicobrachial syndrome: Secondary | ICD-10-CM | POA: Diagnosis not present

## 2019-04-28 ENCOUNTER — Ambulatory Visit (INDEPENDENT_AMBULATORY_CARE_PROVIDER_SITE_OTHER): Payer: 59 | Admitting: Family Medicine

## 2019-04-28 ENCOUNTER — Other Ambulatory Visit: Payer: Self-pay

## 2019-04-28 ENCOUNTER — Encounter: Payer: Self-pay | Admitting: Family Medicine

## 2019-04-28 VITALS — Ht 62.0 in | Wt 128.0 lb

## 2019-04-28 DIAGNOSIS — H04301 Unspecified dacryocystitis of right lacrimal passage: Secondary | ICD-10-CM

## 2019-04-28 MED ORDER — ERYTHROMYCIN 5 MG/GM OP OINT: 1.0000 "application " | TOPICAL_OINTMENT | Freq: Every day | OPHTHALMIC | 0 refills | Status: AC

## 2019-04-28 NOTE — Progress Notes (Signed)
VIRTUAL VISIT VIA VIDEO  I connected with Carla Watts on 04/28/19 at  1:30 PM EST by elemedicine application and verified that I am speaking with the correct person using two identifiers. Location patient: Home Location provider: [facility], Office Persons participating in the virtual visit: Patient, Dr. Raoul Pitch and R.Baker, LPN  I discussed the limitations of evaluation and management by telemedicine and the availability of in person appointments. The patient expressed understanding and agreed to proceed.   SUBJECTIVE Chief Complaint  Patient presents with  . Eye Problem    right eye    HPI: Carla Watts is a 54 y.o. female present for right eye swelling and discomfort.  Patient reports for approximately 3 weeks her medial lower eye has been red and mildly swollen.  She states it did have a bump in that location as well.  She has been using warm compresses over this area and it seems to have decreased the swelling or redness but it will come back intermittently with discomfort.  She denies upper respiratory infectious symptoms.  She denies fever chills nausea or vomiting.  ROS: See pertinent positives and negatives per HPI.  Patient Active Problem List   Diagnosis Date Noted  . 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 Family history of breast cancer   . Family history of ovarian cancer   . Family history of prostate cancer   . Family history of brain cancer   . Vitamin D deficiency 11/25/2014  . Palpitations 11/24/2014    Social History   Tobacco Use  . Smoking status: Former Research scientist (life sciences)  . Smokeless tobacco: Never Used  Substance Use Topics  . Alcohol use: Yes    Comment: socially    Current Outpatient Medications:  .  Cholecalciferol (VITAMIN D-3) 1000 units CAPS, Take 1,000 Units by mouth., Disp: , Rfl:  .  ELDERBERRY PO, Take by mouth., Disp: , Rfl:  .  Multiple Vitamin (MULTIVITAMIN) capsule, Take 1 capsule by mouth daily.,  Disp: , Rfl:  .  Omega-3 Fatty Acids (FISH OIL ADULT GUMMIES PO), Take by mouth., Disp: , Rfl:  .  erythromycin ophthalmic ointment, Place 1 application into the right eye at bedtime for 7 days., Disp: 3.5 g, Rfl: 0  Allergies  Allergen Reactions  . Meloxicam Rash  . Penicillins Rash    OBJECTIVE: Ht _0  (1.575 m)   Wt 128 lb (58.1 kg)   BMI 23.41 kg/m  Gen: No acute distress. Nontoxic in appearance.  HENT: AT. [place].  MMM.  Eyes:Pupils Equal Round Reactive to light, Extraocular movements intact,  Conjunctiva without redness, discharge or icterus. Mild redness and swelling lower medial canthus near lacrimal duct.  Chest: Cough  Or shortness of breath not present.  Neuro: Normal gait. Alert. Oriented x3  Psych: Normal affect and demeanor. Normal speech. Normal thought content and judgment.  ASSESSMENT AND PLAN: Carla Watts is a 54 y.o. female present for  Infection of right lacrimal duct/stye It appears patient has either a stye or a blocked/infected lacrimal duct of her right lower eyelid.  Mild redness on exam today. Discussed warm compresses and Kiel baby shampoo cleanses daily. Erythromycin ointment nightly x7 days. If symptoms are not resolved within 7-10 days follow-up recommended.  Preferably in person if possible for better visualization and exam.  No orders of the defined types were placed in this encounter.  Meds ordered this encounter  Medications  . erythromycin ophthalmic ointment    Sig:  Place 1 application into the right eye at bedtime for 7 days.    Dispense:  3.5 g    Refill:  0     Courtnay Petrilla, DO 04/28/2019   No follow-ups on file.  No orders of the defined types were placed in this encounter.  Meds ordered this encounter  Medications  . erythromycin ophthalmic ointment    Sig: Place 1 application into the right eye at bedtime for 7 days.    Dispense:  3.5 g    Refill:  0   Referral Orders  No referral(s) requested today

## 2019-04-28 NOTE — Patient Instructions (Signed)
Dacryocystitis Dacryocystitis is an infection of the sac that collects tears (lacrimal sac). The lacrimal sac is located between the inner corner of the eye and the nose. The glands of the eyelids make tears that keep the surface of the eye wet and protected. Tears drain from two small tubes (ducts) in the eyelids. These ducts carry tears to the lacrimal sac. Another tube (nasolacrimal duct) carries tears from the lacrimal sac down into the back of the nose to the throat. Dacryocystitis can be sudden (acute) or long-lasting (chronic). It usually affects only one eye. What are the causes? The most common cause of this condition is a blocked nasolacrimal duct. When this duct is blocked, tears cannot drain into the nose, and tears become backed up in the lacrimal sac. Bacteria that normally live in the eye, on the skin, or in the nose start to grow inside the sac and cause infection. The nasolacrimal duct may become blocked because of:  A nose or sinus infection that spreads into the duct.  A duct that is abnormally shaped (malformed).  A growth or swelling in the nose.  An injury or surgery that narrows or scars the duct. Dacryocystitis also may start as an eye infection that spreads to the lacrimal sac. Sometimes, the cause of dacryocystitis is not known. What increases the risk? You are more likely to develop this condition if you:  Are older than 53 years of age.  Are female. Women tend to have a narrower nasolacrimal duct than men.  Have had nasal trauma, such as a broken nose or nasal surgery.  Have nasal polyps. What are the signs or symptoms? Symptoms of acute dacryocystitis start suddenly and may include:  Excessive tearing.  A matted, watery eye.  Swelling and redness over the lacrimal sac.  Discharge of mucus or pus into the eye. This may cause blurred vision.  Eye pain.  A fever. Symptoms of chronic dacryocystitis usually include:  More tearing than  usual.  Discharge of mucus or pus into the eye.  Blurred vision. Redness, pain, and swelling are less common with chronic dacryocystitis. How is this diagnosed? This condition is diagnosed based on your medical history and a physical exam. During the exam, your health care provider may press between your eye and the side of your nose to see if discharge flows back into your eye. You may also have tests, such as:  Removal of a sample of discharge from your eye or nose to check for infection.  A test where your health care provider will put a yellow dye in your eye to see if the dye disappears from your eye (dye disappearance test). A swab may be placed in your nose to see if the dye drains to your nose.  A test where a thin, lighted scope (endoscope) is placed in your nose to determine what is causing the duct blockage (nasal endoscopy).  Follow these instructions at home: Medicines  Take over-the-counter and prescription medicines only as told by your health care provider.  Take or apply your antibiotic medicine, drops, or ointment as told by your health care provider. Do not stop taking or applying the antibiotic even if you start to feel better. General instructions  If directed by your health care provider, apply a clean, warm compress to the inside corner of your eye. To do this: ? Wash your hands first. ? Hold the compress over the inside corner of your eye for a few minutes. ? Repeat this every few  hours during the day.  Keep all follow-up visits as told by your health care provider. This is important. Contact a health care provider if:  You have a fever.  Your symptoms come back, do not improve, or get worse. Get help right away if you have:  Redness, swelling, and pain that spread to the tissues around your eye.  A sudden decrease in your vision. Summary  Dacryocystitis can be sudden (acute) or long-lasting (chronic).  The most common cause of this condition is a  blocked nasolacrimal duct.  Acute dacryocystitis is treated with antibiotic medicines.  Chronic dacryocystitis usually needs to be treated with surgery.  Keep all follow-up visits as told by your health care provider. This is important. This information is not intended to replace advice given to you by your health care provider. Make sure you discuss any questions you have with your health care provider. Document Revised: 01/13/2018 Document Reviewed: 01/13/2018 Elsevier Patient Education  2020 [employer].

## 2019-07-01 ENCOUNTER — Other Ambulatory Visit: Payer: Self-pay

## 2019-07-01 ENCOUNTER — Ambulatory Visit (INDEPENDENT_AMBULATORY_CARE_PROVIDER_SITE_OTHER): Payer: 59 | Admitting: Family Medicine

## 2019-07-01 ENCOUNTER — Encounter: Payer: Self-pay | Admitting: Family Medicine

## 2019-07-01 VITALS — BP 108/76 | HR 69 | Temp 97.7°F | Resp 16 | Ht 62.0 in | Wt 129.5 lb

## 2019-07-01 DIAGNOSIS — Z130 Encounter for screening for diseases of the blood and blood-forming organs and certain disorders involving the immune mechanism: Secondary | ICD-10-CM | POA: Diagnosis not present

## 2019-07-01 DIAGNOSIS — Z1501 Genetic susceptibility to malignant neoplasm of breast: Secondary | ICD-10-CM

## 2019-07-01 DIAGNOSIS — Z0000 Encounter for general adult medical examination without abnormal findings: Secondary | ICD-10-CM

## 2019-07-01 DIAGNOSIS — R002 Palpitations: Secondary | ICD-10-CM

## 2019-07-01 DIAGNOSIS — Z13220 Encounter for screening for lipoid disorders: Secondary | ICD-10-CM

## 2019-07-01 DIAGNOSIS — Z8041 Family history of malignant neoplasm of ovary: Secondary | ICD-10-CM

## 2019-07-01 DIAGNOSIS — Z131 Encounter for screening for diabetes mellitus: Secondary | ICD-10-CM | POA: Diagnosis not present

## 2019-07-01 DIAGNOSIS — Z1231 Encounter for screening mammogram for malignant neoplasm of breast: Secondary | ICD-10-CM

## 2019-07-01 DIAGNOSIS — E559 Vitamin D deficiency, unspecified: Secondary | ICD-10-CM | POA: Diagnosis not present

## 2019-07-01 DIAGNOSIS — Z1509 Genetic susceptibility to other malignant neoplasm: Secondary | ICD-10-CM

## 2019-07-01 DIAGNOSIS — Z1502 Genetic susceptibility to malignant neoplasm of ovary: Secondary | ICD-10-CM

## 2019-07-01 DIAGNOSIS — Z8042 Family history of malignant neoplasm of prostate: Secondary | ICD-10-CM

## 2019-07-01 DIAGNOSIS — Z803 Family history of malignant neoplasm of breast: Secondary | ICD-10-CM

## 2019-07-01 DIAGNOSIS — Z1589 Genetic susceptibility to other disease: Secondary | ICD-10-CM

## 2019-07-01 DIAGNOSIS — Z1211 Encounter for screening for malignant neoplasm of colon: Secondary | ICD-10-CM

## 2019-07-01 LAB — COMPREHENSIVE METABOLIC PANEL
ALT: 9 U/L (ref 0–35)
AST: 15 U/L (ref 0–37)
Albumin: 4.6 g/dL (ref 3.5–5.2)
Alkaline Phosphatase: 49 U/L (ref 39–117)
BUN: 16 mg/dL (ref 6–23)
CO2: 29 mEq/L (ref 19–32)
Calcium: 9.4 mg/dL (ref 8.4–10.5)
Chloride: 101 mEq/L (ref 96–112)
Creatinine, Ser: 0.81 mg/dL (ref 0.40–1.20)
GFR: 74.06 mL/min (ref >60.00)
Glucose, Bld: 97 mg/dL (ref 70–99)
Potassium: 4.7 mEq/L (ref 3.5–5.1)
Sodium: 137 mEq/L (ref 135–145)
Total Bilirubin: 0.5 mg/dL (ref 0.2–1.2)
Total Protein: 6.9 g/dL (ref 6.0–8.3)

## 2019-07-01 LAB — CBC
HCT: 40.4 % (ref 36.0–46.0)
Hemoglobin: 13.4 g/dL (ref 12.0–15.0)
MCHC: 33 g/dL (ref 30.0–36.0)
MCV: 91.2 fl (ref 78.0–100.0)
Platelets: 261 10*3/uL (ref 150.0–400.0)
RBC: 4.43 Mil/uL (ref 3.87–5.11)
RDW: 13.8 % (ref 11.5–15.5)
WBC: 7.1 10*3/uL (ref 4.0–10.5)

## 2019-07-01 LAB — LIPID PANEL
Cholesterol: 190 mg/dL (ref 0–200)
HDL: 84.1 mg/dL (ref >39.00)
LDL Cholesterol: 96 mg/dL (ref 0–99)
NonHDL: 106.08
Total CHOL/HDL Ratio: 2
Triglycerides: 49 mg/dL (ref 0.0–149.0)
VLDL: 9.8 mg/dL (ref 0.0–40.0)

## 2019-07-01 LAB — HEMOGLOBIN A1C: Hgb A1c MFr Bld: 5.3 % (ref 4.6–6.5)

## 2019-07-01 LAB — TSH: TSH: 1.27 u[IU]/mL (ref 0.35–4.50)

## 2019-07-01 LAB — VITAMIN D 25 HYDROXY (VIT D DEFICIENCY, FRACTURES): VITD: 70.49 ng/mL (ref 30.00–100.00)

## 2019-07-01 NOTE — Progress Notes (Signed)
This visit occurred during the SARS-CoV-2 public health emergency.  Safety protocols were in place, including screening questions prior to the visit, additional usage of staff PPE, and extensive cleaning of exam room while observing appropriate contact time as indicated for disinfecting solutions.    Patient ID: Carla Watts, female  DOB: 11-30-1965, 55 y.o.   MRN: [id number] Patient Care Team    Relationship Specialty Notifications Start End  Ma Hillock, DO PCP - General Family Medicine  11/24/14     Chief Complaint  Patient presents with  . Annual Exam    Fasting. Pap 07/13/2018,  Mammogram-2019, Never had colonoscopy.    Subjective:  Carla Watts is a 54 y.o.  Female  present for CPE. All past medical history, surgical history, allergies, family history, immunizations, medications and social history were updated in the electronic medical record today. All recent labs, ED visits and hospitalizations within the last year were reviewed.  Health maintenance:  Colonoscopy: No fhx, routine screening to start --> referral made to GI again(3rd), she did not schedule last year.  She would desire a colonoscopy over Cologuard which was also offered to her today. Mammogram: Family history of breast cancer.  Last mammogram 02/2018, Birads 1 Completed at [facility].  Ordered today.   Cervical cancer screening: Last Pap smear 2020 by gynecology.  Family history ovarian cancer. Immunizations: tdap 01/2016 UTD, flu shot 12/2017-encouraged yearly, shingrix labs 1-she did not complete series, Covid vaccines completed. Infectious disease screening: HIV completed DEXA: N/A Assistive device: None Oxygen use: None Patient has a Dental home. Hospitalizations/ED visits: Reviewed  Depression screen [facility] 2/9 07/01/2019 02/27/2018 12/18/2016 01/12/2016  Decreased Interest 0 0 0 0  Down, Depressed, Hopeless 0 1 0 0  PHQ - 2 Score 0 1 0 0  Altered sleeping - 1 - -  Tired, decreased  energy - 1 - -  Change in appetite - 2 - -  Feeling bad or failure about yourself  - 0 - -  Trouble concentrating - 0 - -  Moving slowly or fidgety/restless - 0 - -  Suicidal thoughts - 0 - -  PHQ-9 Score - 5 - -  Difficult doing work/chores - Not difficult at all - -   GAD 7 : Generalized Anxiety Score 02/27/2018  Nervous, Anxious, on Edge 1  Control/stop worrying 1  Worry too much - different things 1  Trouble relaxing 0  Restless 0  Easily annoyed or irritable 1  Afraid - awful might happen 1  Total GAD 7 Score 5  Anxiety Difficulty Somewhat difficult    Immunization History  Administered Date(s) Administered  . Influenza,inj,Quad PF,6+ Mos 12/18/2016  . Influenza-Unspecified 01/04/2016, 12/02/2017  . MMR 01/08/2016  . PFIZER SARS-COV-2 Vaccination 03/10/2019, 03/31/2019  . Tdap 11/24/2014, 01/04/2016  . Zoster Recombinat (Shingrix) 02/27/2018   Past Medical History:  Diagnosis Date  . Chicken pox    Allergies  Allergen Reactions  . Meloxicam Rash  . Penicillins Rash   Past Surgical History:  Procedure Laterality Date  . CERVICAL BIOPSY  W/ LOOP ELECTRODE EXCISION    . CESAREAN SECTION    . laparoscopy of uterus    . OOPHORECTOMY Right 2002   dermoid cyst   Family History  Problem Relation Age of Onset  . Hypertension Mother   . Thyroid disease Mother   . Breast cancer Mother 26  . Skin cancer Mother   . Stroke Father   . Skin cancer Father   .  Heart attack Maternal Grandfather   . Heart disease Maternal Grandfather 69       Died of MI  . Arthritis Maternal Grandmother        RA  . Thyroid disease Maternal Grandmother   . Alcohol abuse Paternal Grandfather   . Ovarian cancer Paternal Aunt   . Brain cancer Paternal Uncle   . Thyroid disease Maternal Aunt    Social History   Social History Narrative   Married, 3 children.   - drinks some caffeine.   - uses herbal remedies at time.    - Wears a seatbelt, exercises 3x/w, smoke alarm in the home    - Guns in the home, locked case   - Feels safe in relationship    Allergies as of 07/01/2019      Reactions   Meloxicam Rash   Penicillins Rash      Medication List       Accurate as of July 01, 2019 11:59 PM. If you have any questions, ask your nurse or doctor.        ELDERBERRY PO Take by mouth.   FISH OIL ADULT GUMMIES PO Take by mouth.   multivitamin capsule Take 1 capsule by mouth daily.   vitamin C 100 MG tablet Take 100 mg by mouth daily.   Vitamin D-3 25 MCG (1000 UT) Caps Take 1,000 Units by mouth.       All past medical history, surgical history, allergies, family history, immunizations andmedications were updated in the EMR today and reviewed under the history and medication portions of their EMR.     ROS: 14 pt review of systems performed and negative (unless mentioned in an HPI)  Objective: BP 108/76 (BP Location: Left Arm, Patient Position: Sitting, Cuff Size: Normal)   Pulse 69   Temp 97.7 F (36.5 C) (Temporal)   Resp 16   Ht '5\' 2"'$  (1.575 m)   Wt 129 lb 8 oz (58.7 kg)   LMP 06/19/2019 (Exact Date)   SpO2 99%   BMI 23.69 kg/m  Gen: Afebrile. No acute distress. Nontoxic in appearance, well-developed, well-nourished, pleasant, female HENT: AT. [place]. Bilateral TM visualized and normal in appearance, normal external auditory canal. MMM, no oral lesions, adequate dentition. Bilateral nares within normal limits. Throat without erythema, ulcerations or exudates.  No cough on exam, no hoarseness on exam. Eyes:Pupils Equal Round Reactive to light, Extraocular movements intact,  Conjunctiva without redness, discharge or icterus. Neck/lymp/endocrine: Supple, no lymphadenopathy, no thyromegaly CV: RRR no murmur, no edema, +2/4 P posterior tibialis pulses.   Chest: CTAB, no wheeze, rhonchi or crackles.  Normal respiratory effort.  Good air movement. Abd: Soft.  Flat. NTND. BS present.  No masses palpated. No hepatosplenomegaly. No rebound tenderness or  guarding. Skin: No rashes, purpura or petechiae. Warm and well-perfused. Skin intact. Neuro/Msk:  Normal gait. PERLA. EOMi. Alert. Oriented x3.  Cranial nerves II through XII intact. Muscle strength 5/5 upper/lower extremity. DTRs equal bilaterally. Psych: Normal affect, dress and demeanor. Normal speech. Normal thought content and judgment.   No exam data present  Assessment/plan: Carla Watts is a 54 y.o. female present for CPE Family history of breast cancer/Family history of ovarian cancer/Family history of prostate cancer/Monoallelic mutation of CHEK2 gene in female patient Patient follows with gynecology.  She understands the importance of routine cancer screenings.   She has had genetic counseling Mammogram and GI referral placed today Vitamin D deficiency She is now taking routine supplement of 1000 units daily. -  Vitamin D (25 hydroxy) Screening for deficiency anemia - CBC Diabetes mellitus screening - Hemoglobin A1c Lipid screening - Lipid panel Palpitations - Comprehensive metabolic panel - TSH Encounter for screening mammogram for malignant neoplasm of breast - MM 3D SCREEN BREAST BILATERAL; Future Colon cancer screening - Ambulatory referral to Gastroenterology Encounter for preventive health examination Patient was encouraged to exercise greater than 150 minutes a week. Patient was encouraged to choose a diet filled with fresh fruits and vegetables, and lean meats. AVS provided to patient today for education/recommendation on gender specific health and safety maintenance. No follow-ups on file.   Orders Placed This Encounter  Procedures  . MM 3D SCREEN BREAST BILATERAL  . CBC  . Comprehensive metabolic panel  . Hemoglobin A1c  . Lipid panel  . Vitamin D (25 hydroxy)  . TSH  . Ambulatory referral to Gastroenterology   No orders of the defined types were placed in this encounter.   Referral Orders     Ambulatory referral to Gastroenterology    Electronically signed by: Howard Pouch, DO [place]

## 2019-07-01 NOTE — Patient Instructions (Signed)
Health Maintenance, Female Adopting a healthy lifestyle and getting preventive care are important in promoting health and wellness. Ask your health care provider about:  The right schedule for you to have regular tests and exams.  Things you can do on your own to prevent diseases and keep yourself healthy. What should I know about diet, weight, and exercise? Eat a healthy diet   Eat a diet that includes plenty of vegetables, fruits, low-fat dairy products, and lean protein.  Do not eat a lot of foods that are high in solid fats, added sugars, or sodium. Maintain a healthy weight Body mass index (BMI) is used to identify weight problems. It estimates body fat based on height and weight. Your health care provider can help determine your BMI and help you achieve or maintain a healthy weight. Get regular exercise Get regular exercise. This is one of the most important things you can do for your health. Most adults should:  Exercise for at least 150 minutes each week. The exercise should increase your heart rate and make you sweat (moderate-intensity exercise).  Do strengthening exercises at least twice a week. This is in addition to the moderate-intensity exercise.  Spend less time sitting. Even light physical activity can be beneficial. Watch cholesterol and blood lipids Have your blood tested for lipids and cholesterol at 53 years of age, then have this test every 5 years. Have your cholesterol levels checked more often if:  Your lipid or cholesterol levels are high.  You are older than 53 years of age.  You are at high risk for heart disease. What should I know about cancer screening? Depending on your health history and family history, you may need to have cancer screening at various ages. This may include screening for:  Breast cancer.  Cervical cancer.  Colorectal cancer.  Skin cancer.  Lung cancer. What should I know about heart disease, diabetes, and high blood  pressure? Blood pressure and heart disease  High blood pressure causes heart disease and increases the risk of stroke. This is more likely to develop in people who have high blood pressure readings, are of African descent, or are overweight.  Have your blood pressure checked: ? Every 3-5 years if you are 18-39 years of age. ? Every year if you are 40 years old or older. Diabetes Have regular diabetes screenings. This checks your fasting blood sugar level. Have the screening done:  Once every three years after age 40 if you are at a normal weight and have a low risk for diabetes.  More often and at a younger age if you are overweight or have a high risk for diabetes. What should I know about preventing infection? Hepatitis B If you have a higher risk for hepatitis B, you should be screened for this virus. Talk with your health care provider to find out if you are at risk for hepatitis B infection. Hepatitis C Testing is recommended for:  Everyone born from 1945 through 1965.  Anyone with known risk factors for hepatitis C. Sexually transmitted infections (STIs)  Get screened for STIs, including gonorrhea and chlamydia, if: ? You are sexually active and are younger than 53 years of age. ? You are older than 53 years of age and your health care provider tells you that you are at risk for this type of infection. ? Your sexual activity has changed since you were last screened, and you are at increased risk for chlamydia or gonorrhea. Ask your health care provider if   you are at risk.  Ask your health care provider about whether you are at high risk for HIV. Your health care provider may recommend a prescription medicine to help prevent HIV infection. If you choose to take medicine to prevent HIV, you should first get tested for HIV. You should then be tested every 3 months for as long as you are taking the medicine. Pregnancy  If you are about to stop having your period (premenopausal) and  you may become pregnant, seek counseling before you get pregnant.  Take 400 to 800 micrograms (mcg) of folic acid every day if you become pregnant.  Ask for birth control (contraception) if you want to prevent pregnancy. Osteoporosis and menopause Osteoporosis is a disease in which the bones lose minerals and strength with aging. This can result in bone fractures. If you are 65 years old or older, or if you are at risk for osteoporosis and fractures, ask your health care provider if you should:  Be screened for bone loss.  Take a calcium or vitamin D supplement to lower your risk of fractures.  Be given hormone replacement therapy (HRT) to treat symptoms of menopause. Follow these instructions at home: Lifestyle  Do not use any products that contain nicotine or tobacco, such as cigarettes, e-cigarettes, and chewing tobacco. If you need help quitting, ask your health care provider.  Do not use street drugs.  Do not share needles.  Ask your health care provider for help if you need support or information about quitting drugs. Alcohol use  Do not drink alcohol if: ? Your health care provider tells you not to drink. ? You are pregnant, may be pregnant, or are planning to become pregnant.  If you drink alcohol: ? Limit how much you use to 0-1 drink a day. ? Limit intake if you are breastfeeding.  Be aware of how much alcohol is in your drink. In the [place] one drink equals one 12 oz bottle of beer (355 mL), one 5 oz glass of wine (148 mL), or one 1 oz glass of hard liquor (44 mL). General instructions  Schedule regular health, dental, and eye exams.  Stay current with your vaccines.  Tell your health care provider if: ? You often feel depressed. ? You have ever been abused or do not feel safe at home. Summary  Adopting a healthy lifestyle and getting preventive care are important in promoting health and wellness.  Follow your health care provider's instructions about healthy  diet, exercising, and getting tested or screened for diseases.  Follow your health care provider's instructions on monitoring your cholesterol and blood pressure. This information is not intended to replace advice given to you by your health care provider. Make sure you discuss any questions you have with your health care provider. Document Revised: 02/11/2018 Document Reviewed: 02/11/2018 Elsevier Patient Education  2020 Elsevier Inc.  

## 2019-07-02 ENCOUNTER — Encounter: Payer: Self-pay | Admitting: Family Medicine

## 2019-07-05 ENCOUNTER — Encounter: Payer: Self-pay | Admitting: Gastroenterology

## 2019-07-20 ENCOUNTER — Ambulatory Visit (HOSPITAL_BASED_OUTPATIENT_CLINIC_OR_DEPARTMENT_OTHER)
Admission: RE | Admit: 2019-07-20 | Discharge: 2019-07-20 | Disposition: A | Discharge status: Home / Self Care | Payer: 59 | Source: Ambulatory Visit | Attending: Family Medicine | Admitting: Family Medicine

## 2019-07-20 ENCOUNTER — Other Ambulatory Visit: Payer: Self-pay

## 2019-07-20 DIAGNOSIS — Z1231 Encounter for screening mammogram for malignant neoplasm of breast: Secondary | ICD-10-CM | POA: Diagnosis not present

## 2019-07-22 ENCOUNTER — Other Ambulatory Visit: Payer: Self-pay | Admitting: Family Medicine

## 2019-07-22 DIAGNOSIS — R928 Other abnormal and inconclusive findings on diagnostic imaging of breast: Secondary | ICD-10-CM

## 2019-07-28 ENCOUNTER — Ambulatory Visit (AMBULATORY_SURGERY_CENTER): Payer: Self-pay | Admitting: *Deleted

## 2019-07-28 ENCOUNTER — Other Ambulatory Visit: Payer: Self-pay

## 2019-07-28 VITALS — Ht 62.0 in | Wt 128.0 lb

## 2019-07-28 DIAGNOSIS — Z1211 Encounter for screening for malignant neoplasm of colon: Secondary | ICD-10-CM

## 2019-07-28 NOTE — Progress Notes (Signed)
03-31-2019 comp covid vacc   No egg or soy allergy known to patient  No issues with past sedation with any surgeries  or procedures, no intubation problems  No diet pills per patient No home 02 use per patient  No blood thinners per patient  Pt denies issues with constipation  No A fib or A flutter  EMMI video sent to pt's e mail   Due to the COVID-19 pandemic we are asking patients to follow these guidelines. Please only bring one care partner. Please be aware that your care partner may wait in the car in the parking lot or if they feel like they will be too hot to wait in the car, they may wait in the lobby on the 4th floor. All care partners are required to wear a mask the entire time (we do not have any that we can provide them), they need to practice social distancing, and we will do a Covid check for all patient's and care partners when you arrive. Also we will check their temperature and your temperature. If the care partner waits in their car they need to stay in the parking lot the entire time and we will call them on their cell phone when the patient is ready for discharge so they can bring the car to the front of the building. Also all patient's will need to wear a mask into building.

## 2019-08-04 ENCOUNTER — Other Ambulatory Visit: Payer: Self-pay

## 2019-08-04 ENCOUNTER — Ambulatory Visit
Admission: RE | Admit: 2019-08-04 | Discharge: 2019-08-04 | Disposition: A | Discharge status: Home / Self Care | Payer: 59 | Source: Ambulatory Visit | Attending: Family Medicine | Admitting: Family Medicine

## 2019-08-04 DIAGNOSIS — R928 Other abnormal and inconclusive findings on diagnostic imaging of breast: Secondary | ICD-10-CM

## 2019-08-05 ENCOUNTER — Encounter: Payer: Self-pay | Admitting: Gastroenterology

## 2019-08-05 ENCOUNTER — Encounter: Payer: Self-pay | Admitting: Family Medicine

## 2019-08-05 DIAGNOSIS — N6001 Solitary cyst of right breast: Secondary | ICD-10-CM | POA: Insufficient documentation

## 2019-08-11 ENCOUNTER — Encounter: Payer: 59 | Admitting: Gastroenterology

## 2019-08-15 ENCOUNTER — Encounter: Payer: Self-pay | Admitting: Certified Registered Nurse Anesthetist

## 2019-08-16 ENCOUNTER — Ambulatory Visit (AMBULATORY_SURGERY_CENTER): Payer: 59 | Admitting: Gastroenterology

## 2019-08-16 ENCOUNTER — Other Ambulatory Visit: Payer: Self-pay

## 2019-08-16 ENCOUNTER — Encounter: Payer: Self-pay | Admitting: Gastroenterology

## 2019-08-16 VITALS — BP 112/75 | HR 73 | Temp 97.1°F | Resp 13 | Ht 62.0 in | Wt 128.0 lb

## 2019-08-16 DIAGNOSIS — Z1211 Encounter for screening for malignant neoplasm of colon: Secondary | ICD-10-CM

## 2019-08-16 MED ORDER — SODIUM CHLORIDE 0.9 % IV SOLN: 500.0000 mL | Freq: Once | INTRAVENOUS | Status: DC

## 2019-08-16 NOTE — Patient Instructions (Signed)
Handout provided on hemorrhoids.   YOU HAD AN ENDOSCOPIC PROCEDURE TODAY AT THE [facility]:   Refer to the procedure report that was given to you for any specific questions about what was found during the examination.  If the procedure report does not answer your questions, please call your gastroenterologist to clarify.  If you requested that your care partner not be given the details of your procedure findings, then the procedure report has been included in a sealed envelope for you to review at your convenience later.  YOU SHOULD EXPECT: Some feelings of bloating in the abdomen. Passage of more gas than usual.  Walking can help get rid of the air that was put into your GI tract during the procedure and reduce the bloating. If you had a lower endoscopy (such as a colonoscopy or flexible sigmoidoscopy) you may notice spotting of blood in your stool or on the toilet paper. If you underwent a bowel prep for your procedure, you may not have a normal bowel movement for a few days.  Please Note:  You might notice some irritation and congestion in your nose or some drainage.  This is from the oxygen used during your procedure.  There is no need for concern and it should clear up in a day or so.  SYMPTOMS TO REPORT IMMEDIATELY:  Following lower endoscopy (colonoscopy or flexible sigmoidoscopy):  Excessive amounts of blood in the stool  Significant tenderness or worsening of abdominal pains  Swelling of the abdomen that is new, acute  Fever of 100F or higher  For urgent or emergent issues, a gastroenterologist can be reached at any hour by calling [phone]. Do not use MyChart messaging for urgent concerns.    DIET:  We do recommend a small meal at first, but then you may proceed to your regular diet.  Drink plenty of fluids but you should avoid alcoholic beverages for 24 hours.  ACTIVITY:  You should plan to take it easy for the rest of today and you should NOT DRIVE or use heavy  machinery until tomorrow (because of the sedation medicines used during the test).    FOLLOW UP: Our staff will call the number listed on your records 48-72 hours following your procedure to check on you and address any questions or concerns that you may have regarding the information given to you following your procedure. If we do not reach you, we will leave a message.  We will attempt to reach you two times.  During this call, we will ask if you have developed any symptoms of COVID 19. If you develop any symptoms (ie: fever, flu-like symptoms, shortness of breath, cough etc.) before then, please call [phone].  If you test positive for Covid 19 in the 2 weeks post procedure, please call and report this information to us.    If any biopsies were taken you will be contacted by phone or by letter within the next 1-3 weeks.  Please call us at [phone] if you have not heard about the biopsies in 3 weeks.    SIGNATURES/CONFIDENTIALITY: You and/or your care partner have signed paperwork which will be entered into your electronic medical record.  These signatures attest to the fact that that the information above on your After Visit Summary has been reviewed and is understood.  Full responsibility of the confidentiality of this discharge information lies with you and/or your care-partner.  

## 2019-08-16 NOTE — Progress Notes (Signed)
Report given to PACU, vss 

## 2019-08-16 NOTE — Progress Notes (Signed)
Pt's states no medical or surgical changes since previsit or office visit.  Temp- June Vitals- Courtney 

## 2019-08-16 NOTE — Op Note (Signed)
[facility] Patient Name: Carla Watts Procedure Date: 08/16/2019 9:02 AM MRN: [id number] Endoscopist: Tressia Danas MD, MD Age: 54 Referring MD:  Date of Birth: [birth year] Gender: Female Account #: [id number] Procedure:                Colonoscopy Indications:              Screening for colorectal malignant neoplasm, This                            is the patient's first colonoscopy Medicines:                Monitored Anesthesia Care Procedure:                Pre-Anesthesia Assessment:                           - Prior to the procedure, a History and Physical                            was performed, and patient medications and                            allergies were reviewed. The patient's tolerance of                            previous anesthesia was also reviewed. The risks                            and benefits of the procedure and the sedation                            options and risks were discussed with the patient.                            All questions were answered, and informed consent                            was obtained. Prior Anticoagulants: The patient has                            taken no previous anticoagulant or antiplatelet                            agents. ASA Grade Assessment: II - A patient with                            mild systemic disease. After reviewing the risks                            and benefits, the patient was deemed in                            satisfactory condition to undergo the procedure.  After obtaining informed consent, the colonoscope                            was passed under direct vision. Throughout the                            procedure, the patient's blood pressure, pulse, and                            oxygen saturations were monitored continuously. The                            Colonoscope was introduced through the anus and                            advanced to the 3 cm  into the ileum. A second                            forward view of the right colon was performed. The                            colonoscopy was performed without difficulty. The                            patient tolerated the procedure well. The quality                            of the bowel preparation was good. The terminal                            ileum, ileocecal valve, appendiceal orifice, and                            rectum were photographed. Scope In: 9:08:36 AM Scope Out: 9:21:58 AM Scope Withdrawal Time: 0 hours 10 minutes 32 seconds  Total Procedure Duration: 0 hours 13 minutes 22 seconds  Findings:                 Hemorrhoids were found on perianal exam.                           Non-bleeding internal hemorrhoids were found.                           The exam was otherwise without abnormality on                            direct and retroflexion views. Complications:            No immediate complications. Estimated Blood Loss:     Estimated blood loss: none. Impression:               - The examination was otherwise normal on direct  and retroflexion views.                           - No specimens collected. Recommendation:           - Patient has a contact number available for                            emergencies. The signs and symptoms of potential                            delayed complications were discussed with the                            patient. Return to normal activities tomorrow.                            Written discharge instructions were provided to the                            patient.                           - Resume previous diet.                           - Continue present medications.                           - Repeat colonoscopy in 10 years for screening                            purposes.                           - Emerging evidence supports eating a diet of                            fruits, vegetables,  grains, calcium, and yogurt                            while reducing red meat and alcohol may reduce the                            risk of colon cancer.                           - Thank you for allowing me to be involved in your                            colon cancer prevention. Thornton Park MD, MD 08/16/2019 9:26:04 AM This report has been signed electronically.

## 2019-08-18 ENCOUNTER — Telehealth: Payer: Self-pay

## 2019-08-18 ENCOUNTER — Telehealth: Payer: Self-pay | Admitting: *Deleted

## 2019-08-18 NOTE — Telephone Encounter (Signed)
No answer, left message to call back later today, B.Montavious Wierzba RN. 

## 2019-08-18 NOTE — Telephone Encounter (Signed)
  Follow up Call-  Call back number 08/16/2019  Post procedure Call Back phone  # [phone]  Permission to leave phone message Yes  Some recent data might be hidden    LMOM to call back with any questions or concerns.  Also, call back if patient has developed fever, respiratory issues or been dx with COVID or had any family members or close contacts diagnosed since her procedure.

## 2019-08-25 ENCOUNTER — Telehealth: Payer: Self-pay | Admitting: Gastroenterology

## 2019-08-25 NOTE — Telephone Encounter (Signed)
Pt states that she has not been feeling well since her procedure, she has been experiencing a lot of bloating and constipation. Pt would like some advise.

## 2019-08-25 NOTE — Telephone Encounter (Signed)
Spoke with pt and let her know she can try miralax 1-3 doses to have BM then take 1 dose daily. Pt to try this for 3-4 days, she knows to call back if no improvement.

## 2020-01-07 ENCOUNTER — Telehealth: Payer: Self-pay | Admitting: Family Medicine

## 2020-01-07 NOTE — Telephone Encounter (Signed)
Patient dropped off biometric screening form for Dr. Claiborne Billings to fill out for her insurance. Form placed in Dr. Alan Ripper inbox in front office.

## 2020-01-10 NOTE — Telephone Encounter (Signed)
Form faxed

## 2020-01-10 NOTE — Telephone Encounter (Signed)
Form on desk

## 2020-01-10 NOTE — Telephone Encounter (Signed)
Completed form and placed in cma work basket.

## 2020-05-15 ENCOUNTER — Encounter: Payer: Self-pay | Admitting: Family Medicine

## 2020-05-15 ENCOUNTER — Telehealth: Payer: Self-pay

## 2020-05-15 ENCOUNTER — Telehealth (INDEPENDENT_AMBULATORY_CARE_PROVIDER_SITE_OTHER): Payer: 59 | Admitting: Family Medicine

## 2020-05-15 DIAGNOSIS — R399 Unspecified symptoms and signs involving the genitourinary system: Secondary | ICD-10-CM

## 2020-05-15 DIAGNOSIS — R300 Dysuria: Secondary | ICD-10-CM

## 2020-05-15 LAB — POCT URINALYSIS DIPSTICK
Bilirubin, UA: NEGATIVE
Glucose, UA: NEGATIVE
Ketones, UA: NEGATIVE
Nitrite, UA: NEGATIVE
Protein, UA: NEGATIVE
Spec Grav, UA: 1.01 (ref 1.010–1.025)
Urobilinogen, UA: 0.2 E.U./dL
pH, UA: 6 (ref 5.0–8.0)

## 2020-05-15 MED ORDER — CEPHALEXIN 500 MG PO CAPS
500.0000 mg | ORAL_CAPSULE | Freq: Three times a day (TID) | ORAL | 0 refills | Status: DC
Start: 2020-05-15 — End: 2020-05-19

## 2020-05-15 NOTE — Progress Notes (Signed)
VIRTUAL VISIT VIA VIDEO  I connected with Carla Watts on 05/15/20 at  4:00 PM EDT by elemedicine application and verified that I am speaking with the correct person using two identifiers. Location patient: Home Location provider: [facility], Office Persons participating in the virtual visit: Patient, Dr. Raoul Pitch and Samul Dada, CMA  I discussed the limitations of evaluation and management by telemedicine and the availability of in person appointments. The patient expressed understanding and agreed to proceed.   SUBJECTIVE Chief Complaint  Patient presents with  . Dysuria    Pt c/o dysuria, lower abdominal pain x 2 days    HPI: Carla Watts is a 55 y.o. female present for UTI symptoms of 2 days.  Urinary pressure- like she has urinate- not pain. She endorses burning with urination. She has tried to drink more water.  No prior h/o kidney stones. No fever, chills or nausea.  Not on menses.   ROS: See pertinent positives and negatives per HPI.  Patient Active Problem List   Diagnosis Date Noted  . Bilateral breast cysts 08/05/2019  . 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 Family history of breast cancer   . Family history of ovarian cancer   . Family history of prostate cancer   . Family history of brain cancer   . Vitamin D deficiency 11/25/2014  . Palpitations 11/24/2014    Social History   Tobacco Use  . Smoking status: Never Smoker  . Smokeless tobacco: Never Used  . Tobacco comment: in college smoked a few cigs at a party   Substance Use Topics  . Alcohol use: Yes    Comment: socially    Current Outpatient Medications:  .  Ascorbic Acid (VITAMIN C) 100 MG tablet, Take 100 mg by mouth daily., Disp: , Rfl:  .  cephALEXin (KEFLEX) 500 MG capsule, Take 1 capsule (500 mg total) by mouth 3 (three) times daily., Disp: 21 capsule, Rfl: 0 .  Cholecalciferol (VITAMIN D-3) 1000 units CAPS, Take 1,000 Units by mouth., Disp: , Rfl:   .  ELDERBERRY PO, Take by mouth., Disp: , Rfl:  .  Multiple Vitamin (MULTIVITAMIN) capsule, Take 1 capsule by mouth daily., Disp: , Rfl:  .  Omega-3 Fatty Acids (FISH OIL ADULT GUMMIES PO), Take by mouth., Disp: , Rfl:   Allergies  Allergen Reactions  . Meloxicam Rash  . Penicillins Rash    OBJECTIVE: There were no vitals taken for this visit. Gen: No acute distress. Nontoxic in appearance.  HENT: AT. [place].  MMM.  Eyes:Pupils Equal Round Reactive to light, Extraocular movements intact,  Conjunctiva without redness, discharge or icterus. Skin: no rashes, purpura or petechiae.  Neuro: . Alert. Oriented x3  Psych: Normal affect and demeanor. Normal speech. Normal thought content and judgment.  ASSESSMENT AND PLAN: Carla Watts is a 55 y.o. female present for  UTI symptoms/dysuria hpi and poct appear infectious. Elected to treat with kelfex TID while we wait on culture results.  - POCT urinalysis dipstick: +leuks and blood +3 - Urine Culture - f/u prn, sooner if worsening.     Howard Pouch, DO 05/15/2020   No follow-ups on file.  Orders Placed This Encounter  Procedures  . Urine Culture  . POCT urinalysis dipstick   Meds ordered this encounter  Medications  . cephALEXin (KEFLEX) 500 MG capsule    Sig: Take 1 capsule (500 mg total) by mouth 3 (three) times daily.    Dispense:  21 capsule  Refill:  0   Referral Orders  No referral(s) requested today

## 2020-05-15 NOTE — Telephone Encounter (Signed)
Please assist patient with scheduling. Advise that she not wait until her appt on Friday due to symptoms and type of appointment she has already scheduled.  [facility] Day - Client TELEPHONE ADVICE RECORD Access[facility]urse Patient Name: Carla Watts Gender: Female DOB: [birth year] Age: 55 Y 6 M 10 D Return Phone Number: [phone] (Primary) Address[place] [address] Client [facility] Day - Client Client Site [facility] - Day Physician Claiborne Billings, [place] Contact Type Call Who Is Calling Patient / Member / Family / Caregiver Call Type Triage / Clinical Relationship To Patient Self Return Phone Number [phone] (Primary) Chief Complaint Urination Frequency Reason for Call Symptomatic / Request for Health Information Initial Comment Caller states they think they have a UTI starting. The caller says they are having painful/weird feeling when urinating and are urinating frequently. Translation No Nurse Assessment Nurse: Mariah Milling, RN, Diana Eves Date/Time (Eastern Time): 05/14/2020 2:32:10 PM Confirm and document reason for call. If symptomatic, describe symptoms. ---Caller states they think they have a UTI starting. The caller says they are having painful/weird feeling when urinating and are urinating frequently. no fever. no vomiting. saw a few spots of blood in urine. no back or side pain. caller has bene eating ok. drinking fluids. Does the patient have any new or worsening symptoms? ---Yes Will a triage be completed? ---Yes Related visit to physician within the last 2 weeks? ---No Does the PT have any chronic conditions? (i.e. diabetes, asthma, this includes High risk factors for pregnancy, etc.) ---No Is the patient pregnant or possibly pregnant? (Ask all females between the ages of 18-55) ---No Is this a behavioral health or substance abuse call? ---No Guidelines Guideline Title Affirmed Question  Affirmed Notes Nurse Date/Time (Eastern Time) Urinary Symptoms Urinating more frequently than usual (i.e., frequency) Mariah Milling, RN, Diana Eves 05/14/2020 2:34:49 PM Disp. Time Lamount Cohen Time) Disposition Final User 05/14/2020 2:38:56 PM See PCP within 24 Hours Yes Mariah Milling, RN, Diana Eves PLEASE NOTE: All timestamps contained within this report are represented as [place] Standard Time. CONFIDENTIALTY NOTICE: This fax transmission is intended only for the addressee. It contains information that is legally privileged, confidential or otherwise protected from use or disclosure. If you are not the intended recipient, you are strictly prohibited from reviewing, disclosing, copying using or disseminating any of this information or taking any action in reliance on or regarding this information. If you have received this fax in error, please notify us immediately by telephone so that we can arrange for its return to [place]. Phone: [phone], Toll-Free: [phone], Fax: [phone] Page: 2 of 2 Call Id: [id number] Caller Disagree/Comply Comply Caller Understands Yes PreDisposition Did not know what to do Care Advice Given Per Guideline SEE PCP WITHIN 24 HOURS: * IF OFFICE WILL BE OPEN: You need to be examined within the next 24 hours. Call your doctor (or NP/PA) when the office opens and make an appointment. CARE ADVICE given per Urinary Symptoms (Adult) guideline. * You become worse * Unable to urinate and bladder feels full * Fever occurs CALL BACK IF: Referrals REFERRED TO PCP OFFICE

## 2020-05-15 NOTE — Patient Instructions (Signed)
Urinary Tract Infection, Adult A urinary tract infection (UTI) is an infection of any part of the urinary tract. The urinary tract includes:  The kidneys.  The ureters.  The bladder.  The urethra. These organs make, store, and get rid of pee (urine) in the body. What are the causes? This infection is caused by germs (bacteria) in your genital area. These germs grow and cause swelling (inflammation) of your urinary tract. What increases the risk? The following factors may make you more likely to develop this condition:  Using a small, thin tube (catheter) to drain pee.  Not being able to control when you pee or poop (incontinence).  Being female. If you are female, these things can increase the risk: ? Using these methods to prevent pregnancy:  A medicine that kills sperm (spermicide).  A device that blocks sperm (diaphragm). ? Having low levels of a female hormone (estrogen). ? Being pregnant. You are more likely to develop this condition if:  You have genes that add to your risk.  You are sexually active.  You take antibiotic medicines.  You have trouble peeing because of: ? A prostate that is bigger than normal, if you are female. ? A blockage in the part of your body that drains pee from the bladder. ? A kidney stone. ? A nerve condition that affects your bladder. ? Not getting enough to drink. ? Not peeing often enough.  You have other conditions, such as: ? Diabetes. ? A weak disease-fighting system (immune system). ? Sickle cell disease. ? Gout. ? Injury of the spine. What are the signs or symptoms? Symptoms of this condition include:  Needing to pee right away.  Peeing small amounts often.  Pain or burning when peeing.  Blood in the pee.  Pee that smells bad or not like normal.  Trouble peeing.  Pee that is cloudy.  Fluid coming from the vagina, if you are female.  Pain in the belly or lower back. Other symptoms include:  Vomiting.  Not  feeling hungry.  Feeling mixed up (confused). This may be the first symptom in older adults.  Being tired and grouchy (irritable).  A fever.  Watery poop (diarrhea). How is this treated?  Taking antibiotic medicine.  Taking other medicines.  Drinking enough water. In some cases, you may need to see a specialist. Follow these instructions at home: Medicines  Take over-the-counter and prescription medicines only as told by your doctor.  If you were prescribed an antibiotic medicine, take it as told by your doctor. Do not stop taking it even if you start to feel better. General instructions  Make sure you: ? Pee until your bladder is empty. ? Do not hold pee for a long time. ? Empty your bladder after sex. ? Wipe from front to back after peeing or pooping if you are a female. Use each tissue one time when you wipe.  Drink enough fluid to keep your pee pale yellow.  Keep all follow-up visits.   Contact a doctor if:  You do not get better after 1-2 days.  Your symptoms go away and then come back. Get help right away if:  You have very bad back pain.  You have very bad pain in your lower belly.  You have a fever.  You have chills.  You feeling like you will vomit or you vomit. Summary  A urinary tract infection (UTI) is an infection of any part of the urinary tract.  This condition is caused by   germs in your genital area.  There are many risk factors for a UTI.  Treatment includes antibiotic medicines.  Drink enough fluid to keep your pee pale yellow. This information is not intended to replace advice given to you by your health care provider. Make sure you discuss any questions you have with your health care provider. Document Revised: 10/01/2019 Document Reviewed: 10/01/2019 Elsevier Patient Education  2021 Elsevier Inc.  

## 2020-05-15 NOTE — Telephone Encounter (Signed)
Called patient scheduled mychart virtual appt today 3/14 at 4PM with Dr. Claiborne Billings.  Patient to come by office to give urine sample before 1pm.

## 2020-05-17 LAB — URINE CULTURE
MICRO NUMBER:: 11644129
SPECIMEN QUALITY:: ADEQUATE

## 2020-05-18 ENCOUNTER — Other Ambulatory Visit: Payer: Self-pay

## 2020-05-19 ENCOUNTER — Encounter: Payer: Self-pay | Admitting: Family Medicine

## 2020-05-19 ENCOUNTER — Ambulatory Visit (INDEPENDENT_AMBULATORY_CARE_PROVIDER_SITE_OTHER): Payer: 59 | Admitting: Family Medicine

## 2020-05-19 VITALS — BP 94/63 | HR 72 | Temp 98.2°F | Ht 62.0 in

## 2020-05-19 DIAGNOSIS — E559 Vitamin D deficiency, unspecified: Secondary | ICD-10-CM

## 2020-05-19 DIAGNOSIS — Z803 Family history of malignant neoplasm of breast: Secondary | ICD-10-CM

## 2020-05-19 DIAGNOSIS — S8991XA Unspecified injury of right lower leg, initial encounter: Secondary | ICD-10-CM | POA: Diagnosis not present

## 2020-05-19 DIAGNOSIS — N926 Irregular menstruation, unspecified: Secondary | ICD-10-CM

## 2020-05-19 DIAGNOSIS — Z13220 Encounter for screening for lipoid disorders: Secondary | ICD-10-CM | POA: Diagnosis not present

## 2020-05-19 DIAGNOSIS — Z1231 Encounter for screening mammogram for malignant neoplasm of breast: Secondary | ICD-10-CM

## 2020-05-19 DIAGNOSIS — Z130 Encounter for screening for diseases of the blood and blood-forming organs and certain disorders involving the immune mechanism: Secondary | ICD-10-CM | POA: Diagnosis not present

## 2020-05-19 DIAGNOSIS — H60332 Swimmer's ear, left ear: Secondary | ICD-10-CM

## 2020-05-19 DIAGNOSIS — Z131 Encounter for screening for diabetes mellitus: Secondary | ICD-10-CM | POA: Diagnosis not present

## 2020-05-19 DIAGNOSIS — R002 Palpitations: Secondary | ICD-10-CM | POA: Diagnosis not present

## 2020-05-19 DIAGNOSIS — H9313 Tinnitus, bilateral: Secondary | ICD-10-CM

## 2020-05-19 DIAGNOSIS — Z0000 Encounter for general adult medical examination without abnormal findings: Secondary | ICD-10-CM

## 2020-05-19 DIAGNOSIS — Z1159 Encounter for screening for other viral diseases: Secondary | ICD-10-CM

## 2020-05-19 LAB — CBC WITH DIFFERENTIAL/PLATELET
Basophils Absolute: 0.1 10*3/uL (ref 0.0–0.1)
Basophils Relative: 1.1 % (ref 0.0–3.0)
Eosinophils Absolute: 0.1 10*3/uL (ref 0.0–0.7)
Eosinophils Relative: 0.9 % (ref 0.0–5.0)
HCT: 41.3 % (ref 36.0–46.0)
Hemoglobin: 13.6 g/dL (ref 12.0–15.0)
Lymphocytes Relative: 31.1 % (ref 12.0–46.0)
Lymphs Abs: 1.9 10*3/uL (ref 0.7–4.0)
MCHC: 33 g/dL (ref 30.0–36.0)
MCV: 89.3 fl (ref 78.0–100.0)
Monocytes Absolute: 0.6 10*3/uL (ref 0.1–1.0)
Monocytes Relative: 10.5 % (ref 3.0–12.0)
Neutro Abs: 3.4 10*3/uL (ref 1.4–7.7)
Neutrophils Relative %: 56.4 % (ref 43.0–77.0)
Platelets: 249 10*3/uL (ref 150.0–400.0)
RBC: 4.62 Mil/uL (ref 3.87–5.11)
RDW: 13.4 % (ref 11.5–15.5)
WBC: 6 10*3/uL (ref 4.0–10.5)

## 2020-05-19 LAB — LIPID PANEL
Cholesterol: 189 mg/dL (ref 0–200)
HDL: 81.1 mg/dL (ref >39.00)
LDL Cholesterol: 95 mg/dL (ref 0–99)
NonHDL: 108.25
Total CHOL/HDL Ratio: 2
Triglycerides: 64 mg/dL (ref 0.0–149.0)
VLDL: 12.8 mg/dL (ref 0.0–40.0)

## 2020-05-19 LAB — HEMOGLOBIN A1C: Hgb A1c MFr Bld: 5.5 % (ref 4.6–6.5)

## 2020-05-19 LAB — COMPREHENSIVE METABOLIC PANEL
ALT: 9 U/L (ref 0–35)
AST: 14 U/L (ref 0–37)
Albumin: 4.3 g/dL (ref 3.5–5.2)
Alkaline Phosphatase: 41 U/L (ref 39–117)
BUN: 15 mg/dL (ref 6–23)
CO2: 27 mEq/L (ref 19–32)
Calcium: 9.3 mg/dL (ref 8.4–10.5)
Chloride: 102 mEq/L (ref 96–112)
Creatinine, Ser: 0.7 mg/dL (ref 0.40–1.20)
GFR: 98.66 mL/min (ref >60.00)
Glucose, Bld: 81 mg/dL (ref 70–99)
Potassium: 4.3 mEq/L (ref 3.5–5.1)
Sodium: 139 mEq/L (ref 135–145)
Total Bilirubin: 0.5 mg/dL (ref 0.2–1.2)
Total Protein: 6.7 g/dL (ref 6.0–8.3)

## 2020-05-19 LAB — VITAMIN D 25 HYDROXY (VIT D DEFICIENCY, FRACTURES): VITD: 37.58 ng/mL (ref 30.00–100.00)

## 2020-05-19 LAB — TSH: TSH: 1.57 u[IU]/mL (ref 0.35–4.50)

## 2020-05-19 MED ORDER — CIPROFLOXACIN-DEXAMETHASONE 0.3-0.1 % OT SUSP: 4.0000 [drp] | Freq: Two times a day (BID) | OTIC | 0 refills | Status: AC

## 2020-05-19 MED ORDER — CORTISPORIN-TC 3.3-3-10-0.5 MG/ML OT SUSP: 3.0000 [drp] | Freq: Four times a day (QID) | OTIC | 0 refills | Status: DC

## 2020-05-19 NOTE — Patient Instructions (Signed)
Health Maintenance, Female Adopting a healthy lifestyle and getting preventive care are important in promoting health and wellness. Ask your health care provider about:  The right schedule for you to have regular tests and exams.  Things you can do on your own to prevent diseases and keep yourself healthy. What should I know about diet, weight, and exercise? Eat a healthy diet  Eat a diet that includes plenty of vegetables, fruits, low-fat dairy products, and lean protein.  Do not eat a lot of foods that are high in solid fats, added sugars, or sodium.   Maintain a healthy weight Body mass index (BMI) is used to identify weight problems. It estimates body fat based on height and weight. Your health care provider can help determine your BMI and help you achieve or maintain a healthy weight. Get regular exercise Get regular exercise. This is one of the most important things you can do for your health. Most adults should:  Exercise for at least 150 minutes each week. The exercise should increase your heart rate and make you sweat (moderate-intensity exercise).  Do strengthening exercises at least twice a week. This is in addition to the moderate-intensity exercise.  Spend less time sitting. Even light physical activity can be beneficial. Watch cholesterol and blood lipids Have your blood tested for lipids and cholesterol at 54 years of age, then have this test every 5 years. Have your cholesterol levels checked more often if:  Your lipid or cholesterol levels are high.  You are older than 54 years of age.  You are at high risk for heart disease. What should I know about cancer screening? Depending on your health history and family history, you may need to have cancer screening at various ages. This may include screening for:  Breast cancer.  Cervical cancer.  Colorectal cancer.  Skin cancer.  Lung cancer. What should I know about heart disease, diabetes, and high blood  pressure? Blood pressure and heart disease  High blood pressure causes heart disease and increases the risk of stroke. This is more likely to develop in people who have high blood pressure readings, are of African descent, or are overweight.  Have your blood pressure checked: ? Every 3-5 years if you are 18-39 years of age. ? Every year if you are 40 years old or older. Diabetes Have regular diabetes screenings. This checks your fasting blood sugar level. Have the screening done:  Once every three years after age 40 if you are at a normal weight and have a low risk for diabetes.  More often and at a younger age if you are overweight or have a high risk for diabetes. What should I know about preventing infection? Hepatitis B If you have a higher risk for hepatitis B, you should be screened for this virus. Talk with your health care provider to find out if you are at risk for hepatitis B infection. Hepatitis C Testing is recommended for:  Everyone born from 1945 through 1965.  Anyone with known risk factors for hepatitis C. Sexually transmitted infections (STIs)  Get screened for STIs, including gonorrhea and chlamydia, if: ? You are sexually active and are younger than 54 years of age. ? You are older than 54 years of age and your health care provider tells you that you are at risk for this type of infection. ? Your sexual activity has changed since you were last screened, and you are at increased risk for chlamydia or gonorrhea. Ask your health care provider   if you are at risk.  Ask your health care provider about whether you are at high risk for HIV. Your health care provider may recommend a prescription medicine to help prevent HIV infection. If you choose to take medicine to prevent HIV, you should first get tested for HIV. You should then be tested every 3 months for as long as you are taking the medicine. Pregnancy  If you are about to stop having your period (premenopausal) and  you may become pregnant, seek counseling before you get pregnant.  Take 400 to 800 micrograms (mcg) of folic acid every day if you become pregnant.  Ask for birth control (contraception) if you want to prevent pregnancy. Osteoporosis and menopause Osteoporosis is a disease in which the bones lose minerals and strength with aging. This can result in bone fractures. If you are 65 years old or older, or if you are at risk for osteoporosis and fractures, ask your health care provider if you should:  Be screened for bone loss.  Take a calcium or vitamin D supplement to lower your risk of fractures.  Be given hormone replacement therapy (HRT) to treat symptoms of menopause. Follow these instructions at home: Lifestyle  Do not use any products that contain nicotine or tobacco, such as cigarettes, e-cigarettes, and chewing tobacco. If you need help quitting, ask your health care provider.  Do not use street drugs.  Do not share needles.  Ask your health care provider for help if you need support or information about quitting drugs. Alcohol use  Do not drink alcohol if: ? Your health care provider tells you not to drink. ? You are pregnant, may be pregnant, or are planning to become pregnant.  If you drink alcohol: ? Limit how much you use to 0-1 drink a day. ? Limit intake if you are breastfeeding.  Be aware of how much alcohol is in your drink. In the [place] one drink equals one 12 oz bottle of beer (355 mL), one 5 oz glass of wine (148 mL), or one 1 oz glass of hard liquor (44 mL). General instructions  Schedule regular health, dental, and eye exams.  Stay current with your vaccines.  Tell your health care provider if: ? You often feel depressed. ? You have ever been abused or do not feel safe at home. Summary  Adopting a healthy lifestyle and getting preventive care are important in promoting health and wellness.  Follow your health care provider's instructions about healthy  diet, exercising, and getting tested or screened for diseases.  Follow your health care provider's instructions on monitoring your cholesterol and blood pressure. This information is not intended to replace advice given to you by your health care provider. Make sure you discuss any questions you have with your health care provider. Document Revised: 02/11/2018 Document Reviewed: 02/11/2018 Elsevier Patient Education  2021 Elsevier Inc.  

## 2020-05-19 NOTE — Progress Notes (Signed)
This visit occurred during the SARS-CoV-2 public health emergency.  Safety protocols were in place, including screening questions prior to the visit, additional usage of staff PPE, and extensive cleaning of exam room while observing appropriate contact time as indicated for disinfecting solutions.    Patient ID: Carla Watts, female  DOB: [birth year], 55 y.o.   MRN: [id number] Patient Care Team    Relationship Specialty Notifications Start End  Natalia Leatherwood, DO PCP - General Family Medicine  11/24/14   Genia Del, MD Consulting Physician Obstetrics and Gynecology  07/02/19   Tressia Danas, MD Consulting Physician Gastroenterology  05/19/20     Chief Complaint  Patient presents with  . Annual Exam    Pt is fasting    Subjective:  Carla Watts is a 55 y.o.  Female  present for CPE and multiple acute concerns. . All past medical history, surgical history, allergies, family history, immunizations, medications and social history were updated in the electronic medical record today. All recent labs, ED visits and hospitalizations within the last year were reviewed.  Health maintenance:  Colonoscopy:08/16/2019. Dr. Orvan Falconer- 10 yr follow up. Mammogram: Family history of breast cancer.  completed 08/04/2019- [facility] [facility].   Cervical cancer screening: Last Pap smear 2020 by gynecology.  Family history ovarian cancer. Immunizations: tdap 01/2016 UTD, flu shotUTD 2021-encouraged yearly, shingrix labs 1-she did not complete series, Covid vaccines completed. Infectious disease screening: HIV completed agreeable to hep c DEXA: N/A Assistive device: none Oxygen MTP:RFYK Patient has a Dental home. Hospitalizations/ED visits: reviewed  Vitamin D deficiency Not take vitamin D consistently, but does get multiple doses and within a week  Palpitations Patient reports palpitations have been worsening.  They occur usually daily to a few times a week.  She denies association with  caffeine use, stress or sleep disorder.  She denies associated symptoms such as dizziness, chest pain or shortness of breath with the palpitation.  She states they last just for a few seconds they usually make her feel like she has to cough and then resolve.  Irregular menses/weight gain Patient reports she is starting to notice her menses is changing.  She can have a menses every 3 weeks to more recently having a menses every 6 weeks.  She also endorses having a 15 pound weight gain over the last year.  She is rather active.  She denies dietary changes and her exercise has been unchanged as well.  She exercises multiple times throughout the week.  Injury of right knee, initial encounter She reports her right knee has been uncomfortable.  She states it is not painful, but feels full.  She has noticed clicking and popping when going up or down stairs as well.  She denies any weakness or her knee giving out.  She would like to be referred to sports medicine for further evaluation and treatment  tinnutus Patient reports she has had bilateral ringing in her ears for over 2 years.  This has not worsened or improved over this time.  She denies any hearing loss.  She states that she mainly hears the ringing when she is trying to sleep or relax.  It is nonpulsatile in nature.   Depression screen [facility] 2/9 05/15/2020 07/01/2019 02/27/2018 12/18/2016 01/12/2016  Decreased Interest 0 0 0 0 0  Down, Depressed, Hopeless 0 0 1 0 0  PHQ - 2 Score 0 0 1 0 0  Altered sleeping - - 1 - -  Tired, decreased energy - - 1 - -  Change in appetite - - 2 - -  Feeling bad or failure about yourself  - - 0 - -  Trouble concentrating - - 0 - -  Moving slowly or fidgety/restless - - 0 - -  Suicidal thoughts - - 0 - -  PHQ-9 Score - - 5 - -  Difficult doing work/chores - - Not difficult at all - -   GAD 7 : Generalized Anxiety Score 02/27/2018  Nervous, Anxious, on Edge 1  Control/stop worrying 1  Worry too much -  different things 1  Trouble relaxing 0  Restless 0  Easily annoyed or irritable 1  Afraid - awful might happen 1  Total GAD 7 Score 5  Anxiety Difficulty Somewhat difficult    Immunization History  Administered Date(s) Administered  . Influenza,inj,Quad PF,6+ Mos 12/18/2016  . Influenza-Unspecified 01/04/2016, 12/02/2017, 01/01/2020  . MMR 01/08/2016  . PFIZER(Purple Top)SARS-COV-2 Vaccination 03/10/2019, 03/31/2019, 02/29/2020  . Tdap 11/24/2014, 01/04/2016  . Zoster Recombinat (Shingrix) 02/27/2018   Past Medical History:  Diagnosis Date  . Allergy    mild   . Chicken pox   . GERD (gastroesophageal reflux disease)   . Palpitations    comes and goes    Allergies  Allergen Reactions  . Meloxicam Rash  . Penicillins Rash   Past Surgical History:  Procedure Laterality Date  . CERVICAL BIOPSY  W/ LOOP ELECTRODE EXCISION    . CESAREAN SECTION    . laparoscopy of uterus    . OOPHORECTOMY Right 2002   dermoid cyst   Family History  Problem Relation Age of Onset  . Hypertension Mother   . Thyroid disease Mother   . Breast cancer Mother 55  . Skin cancer Mother   . Stroke Father   . Skin cancer Father   . Heart attack Maternal Grandfather   . Heart disease Maternal Grandfather 55       Died of MI  . Arthritis Maternal Grandmother        RA  . Thyroid disease Maternal Grandmother   . Alcohol abuse Paternal Grandfather   . Ovarian cancer Paternal Aunt   . Brain cancer Paternal Uncle   . Thyroid disease Maternal Aunt   . Colon cancer Neg Hx   . Colon polyps Neg Hx   . Esophageal cancer Neg Hx   . Rectal cancer Neg Hx   . Stomach cancer Neg Hx    Social History   Social History Narrative   Married, 3 children.   - drinks some caffeine.   - uses herbal remedies at time.    - Wears a seatbelt, exercises 3x/w, smoke alarm in the home   - Guns in the home, locked case   - Feels safe in relationship    Allergies as of 05/19/2020      Reactions   Meloxicam  Rash   Penicillins Rash      Medication List       Accurate as of May 19, 2020  9:23 AM. If you have any questions, ask your nurse or doctor.        STOP taking these medications   cephALEXin 500 MG capsule Commonly known as: KEFLEX Stopped by: Howard Pouch, DO     TAKE these medications   ciprofloxacin-dexamethasone OTIC suspension Commonly known as: Ciprodex Place 4 drops into the left ear 2 (two) times daily for 7 days. If this is not on formulary then please inform the office of what is on formulary. Thanks so  much Started by: Felix Pacini, DO   ELDERBERRY PO Take by mouth.   FISH OIL ADULT GUMMIES PO Take by mouth.   multivitamin capsule Take 1 capsule by mouth daily.   vitamin C 100 MG tablet Take 100 mg by mouth daily.   Vitamin D-3 25 MCG (1000 UT) Caps Take 1,000 Units by mouth.        ROS: 14 pt review of systems performed and negative (unless mentioned in an HPI)  Objective: BP 94/63   Pulse 72   Temp 98.2 F (36.8 C) (Oral)   Ht 5\' 2"  (1.575 m)   LMP 05/06/2020   SpO2 100%   BMI 23.41 kg/m  Gen: Afebrile. No acute distress. Nontoxic in appearance, well-developed, well-nourished,  Pleasant female.  HENT: AT. . Right  TM visualized and normal in appearance, normal external auditory canal. Left TM unable to be visualized, cerumen impaction and wht exudates of EAC. MMM, no oral lesions, adequate dentition. Bilateral nares within normal limits. Throat without erythema, ulcerations or exudates. no Cough on exam, no hoarseness on exam. Eyes:Pupils Equal Round Reactive to light, Extraocular movements intact,  Conjunctiva without redness, discharge or icterus. Neck/lymp/endocrine: Supple,no lymphadenopathy, no thyromegaly CV: RRR no murmur, no edema, +2/4 P posterior tibialis pulses.  Chest: CTAB, no wheeze, rhonchi or crackles. normal Respiratory effort. good Air movement. Abd: Soft. flat. NTND. BS present. no Masses palpated. No hepatosplenomegaly.  No rebound tenderness or guarding. Skin: no rashes, purpura or petechiae. Warm and well-perfused. Skin intact. Neuro/Msk:  Normal gait. PERLA. EOMi. Alert. Oriented x3.  Cranial nerves II through XII intact. Muscle strength 5/5 upper/lower extremity. DTRs equal bilaterally. Psych: Normal affect, dress and demeanor. Normal speech. Normal thought content and judgment.  No exam data present  Assessment/plan: Fanchon Papania is a 55 y.o. female present for CPE/and multiple acute Vitamin D deficiency Taking supplement- uncertain dose.  - VITAMIN D 25 Hydroxy (Vit-D Deficiency, Fractures) Family history of breast cancer/Encounter for screening mammogram for malignant neoplasm of breast - MM 3D SCREEN BREAST BILATERAL; Future Screening for deficiency anemia - CBC with Differential/Platelet Diabetes mellitus screening - Hemoglobin A1c Lipid screening - Lipid panel Encounter for hepatitis C screening test for low risk patient - Hepatitis C antibody Palpitations - Comprehensive metabolic panel - TSH - not associated with caffeine use, sleep or stress - referral to cardiology placed. Right knee: No injury- active pt. Discomfort present but not pain.  Referral back to SM Left otitis externa/cerumen impaction: On exam  Noted cerumen impaction and left OE.  Procedure: Cerumen disimpaction Patient was verbally consented to procedure. Water-peroxide solution was applied and gentle ear lavage performed on left ear ear(s).  There were no complications.  Tympanic membrane was visualized after disimpaction.  Tympanic membrane(s) intact.  Auditory canal(s) normal.  Patient tolerated procedure well.  Patient reported relief of symptoms after removal of cerumen. cortisporin too expensive. > ciprodex prescribed.   Tinnitus: Encouraged to start lipo-flavonoid supplement.  Encounter for preventive health examination Patient was encouraged to exercise greater than 150 minutes a week. Patient was  encouraged to choose a diet filled with fresh fruits and vegetables, and lean meats. AVS provided to patient today for education/recommendation on gender specific health and safety maintenance. Colonoscopy:08/16/2019. Dr. 08/18/2019- 10 yr follow up. Mammogram: Family history of breast cancer.  completed 08/04/2019- [facility] [facility].   Cervical cancer screening: Last Pap smear 2020 by gynecology.  Family history ovarian cancer. Immunizations: tdap 01/2016 UTD, flu shotUTD 2021-encouraged yearly, shingrix labs 1-she  did not complete series d/t SE, Covid vaccines completed. Infectious disease screening: HIV completed agreeable to hep c DEXA: N/A  Return in about 1 year (around 05/19/2021) for CPE (30 min).  Orders Placed This Encounter  Procedures  . MM 3D SCREEN BREAST BILATERAL  . CBC with Differential/Platelet  . Comprehensive metabolic panel  . Hemoglobin A1c  . Lipid panel  . TSH  . VITAMIN D 25 Hydroxy (Vit-D Deficiency, Fractures)  . Hepatitis C antibody  . Ambulatory referral to Cardiology  . Ambulatory referral to Sports Medicine   Meds ordered this encounter  Medications  . DISCONTD: neomycin-colistin-hydrocortisone-thonzonium (CORTISPORIN-TC) 3.05-04-08-0.5 MG/ML OTIC suspension    Sig: Place 3 drops into the left ear 4 (four) times daily for 7 days.    Dispense:  10 mL    Refill:  0  . ciprofloxacin-dexamethasone (CIPRODEX) OTIC suspension    Sig: Place 4 drops into the left ear 2 (two) times daily for 7 days. If this is not on formulary then please inform the office of what is on formulary. Thanks so much    Dispense:  7.5 mL    Refill:  0    Referral Orders     Ambulatory referral to Cardiology     Ambulatory referral to Sports Medicine   Electronically signed by: Howard Pouch, [place]

## 2020-06-14 DIAGNOSIS — K219 Gastro-esophageal reflux disease without esophagitis: Secondary | ICD-10-CM | POA: Insufficient documentation

## 2020-06-14 DIAGNOSIS — T7840XA Allergy, unspecified, initial encounter: Secondary | ICD-10-CM | POA: Insufficient documentation

## 2020-06-14 DIAGNOSIS — B019 Varicella without complication: Secondary | ICD-10-CM | POA: Insufficient documentation

## 2020-06-20 ENCOUNTER — Ambulatory Visit (INDEPENDENT_AMBULATORY_CARE_PROVIDER_SITE_OTHER): Payer: BC Managed Care – PPO | Admitting: Cardiology

## 2020-06-20 ENCOUNTER — Encounter: Payer: Self-pay | Admitting: Cardiology

## 2020-06-20 ENCOUNTER — Ambulatory Visit (INDEPENDENT_AMBULATORY_CARE_PROVIDER_SITE_OTHER): Payer: 59

## 2020-06-20 ENCOUNTER — Other Ambulatory Visit: Payer: Self-pay

## 2020-06-20 VITALS — BP 112/70 | HR 72 | Ht 62.0 in | Wt 131.1 lb

## 2020-06-20 DIAGNOSIS — R002 Palpitations: Secondary | ICD-10-CM

## 2020-06-20 DIAGNOSIS — I498 Other specified cardiac arrhythmias: Secondary | ICD-10-CM

## 2020-06-20 DIAGNOSIS — R0789 Other chest pain: Secondary | ICD-10-CM

## 2020-06-20 DIAGNOSIS — R9431 Abnormal electrocardiogram [ECG] [EKG]: Secondary | ICD-10-CM | POA: Diagnosis not present

## 2020-06-20 MED ORDER — METOPROLOL TARTRATE 100 MG PO TABS: ORAL_TABLET | ORAL | 0 refills | Status: DC

## 2020-06-20 NOTE — Progress Notes (Signed)
Cardiology Office Note:    Date:  06/20/2020   ID:  Carla Watts, DOB May 25, 1965, MRN [id number]  PCP:  Natalia Leatherwood, DO  Cardiologist:  Thomasene Ripple, DO  Electrophysiologist:  None   Referring MD: Natalia Leatherwood, DO   I have been experiencing palpitations  History of Present Illness:    Carla Watts is a 55 y.o. female with a hx of GERD, gestational diabetes presents to be evaluated for chest discomfort and palpitations.  The patient tells me Last several weeks she has had intermittent palpitations.  She described this as a abrupt onset of fast heartbeat which lasts for minutes at a time and resolves.  She denies any exacerbating or alleviating factors.  She tells me sometimes when she had the palpitation she feels as if she is going to cough and when she coughs at times the palpitations resolves.   In addition she tells me of her intermittent midsternal chest pain that she experiences.  This is nonexertional.  She notes that recently this has become frequent.     Past Medical History:  Diagnosis Date  . Allergy    mild   . Chicken pox   . GERD (gastroesophageal reflux disease)   . Palpitations    comes and goes     Past Surgical History:  Procedure Laterality Date  . CERVICAL BIOPSY  W/ LOOP ELECTRODE EXCISION    . CESAREAN SECTION    . laparoscopy of uterus    . OOPHORECTOMY Right 2002   dermoid cyst    Current Medications: Current Meds  Medication Sig  . Ascorbic Acid (VITAMIN C) 100 MG tablet Take 100 mg by mouth daily.  . Cholecalciferol (VITAMIN D-3) 1000 units CAPS Take 1,000 Units by mouth.  . ELDERBERRY PO Take 1 tablet by mouth daily.  . metoprolol tartrate (LOPRESSOR) 100 MG tablet Take 2 hours prior to CT  . Multiple Vitamin (MULTIVITAMIN) capsule Take 1 capsule by mouth daily.  . Omega-3 Fatty Acids (FISH OIL ADULT GUMMIES PO) Take 1 tablet by mouth daily.     Allergies:   Meloxicam and Penicillins   Social History    Socioeconomic History  . Marital status: Married    Spouse name: Not on file  . Number of children: Not on file  . Years of education: Not on file  . Highest education level: Not on file  Occupational History  . Occupation: health fitness coach    Comment: yoga instructor  Tobacco Use  . Smoking status: Never Smoker  . Smokeless tobacco: Never Used  . Tobacco comment: in college smoked a few cigs at a party   Vaping Use  . Vaping Use: Never used  Substance and Sexual Activity  . Alcohol use: Yes    Comment: socially  . Drug use: No  . Sexual activity: Yes    Comment:  Vasectomy-1st intercourse 55yo-Fewer than 5 partners  Other Topics Concern  . Not on file  Social History Narrative   Married, 3 children.   - drinks some caffeine.   - uses herbal remedies at time.    - Wears a seatbelt, exercises 3x/w, smoke alarm in the home   - Guns in the home, locked case   - Feels safe in relationship   Social Determinants of Health   Financial Resource Strain: Not on file  Food Insecurity: Not on file  Transportation Needs: Not on file  Physical Activity: Not on file  Stress: Not on file  Social Connections: Not on file     Family History: The patient's family history includes Alcohol abuse in her paternal grandfather; Arthritis in her maternal grandmother; Brain cancer in her paternal uncle; Breast cancer (age of onset: 61) in her mother; Heart attack in her maternal grandfather; Heart disease (age of onset: 54) in her maternal grandfather; Hypertension in her mother; Ovarian cancer in her paternal aunt; Skin cancer in her father and mother; Stroke in her father; Thyroid disease in her maternal aunt, maternal grandmother, and mother. There is no history of Colon cancer, Colon polyps, Esophageal cancer, Rectal cancer, or Stomach cancer.  ROS:   Review of Systems  Constitution: Negative for decreased appetite, fever and weight gain.  HENT: Negative for congestion, ear discharge,  hoarse voice and sore throat.   Eyes: Negative for discharge, redness, vision loss in right eye and visual halos.  Cardiovascular: Reports chest pain and palpitations.  Dyspnea on exertion, leg swelling, orthopnea. Respiratory: Negative for cough, hemoptysis, shortness of breath and snoring.   Endocrine: Negative for heat intolerance and polyphagia.  Hematologic/Lymphatic: Negative for bleeding problem. Does not bruise/bleed easily.  Skin: Negative for flushing, nail changes, rash and suspicious lesions.  Musculoskeletal: Negative for arthritis, joint pain, muscle cramps, myalgias, neck pain and stiffness.  Gastrointestinal: Negative for abdominal pain, bowel incontinence, diarrhea and excessive appetite.  Genitourinary: Negative for decreased libido, genital sores and incomplete emptying.  Neurological: Negative for brief paralysis, focal weakness, headaches and loss of balance.  Psychiatric/Behavioral: Negative for altered mental status, depression and suicidal ideas.  Allergic/Immunologic: Negative for HIV exposure and persistent infections.    EKGs/Labs/Other Studies Reviewed:    The following studies were reviewed today:   EKG:  The ekg ordered today demonstrates sinus rhythm, heart rate 70 bpm with sinus arrhythmia, poor R wave progression cannot rule out inferior wall infarction.  Recent Labs: 05/19/2020: ALT 9; BUN 15; Creatinine, Ser 0.70; Hemoglobin 13.6; Platelets 249.0; Potassium 4.3; Sodium 139; TSH 1.57  Recent Lipid Panel    Component Value Date/Time   CHOL 189 05/19/2020 0851   TRIG 64.0 05/19/2020 0851   HDL 81.10 05/19/2020 0851   CHOLHDL 2 05/19/2020 0851   VLDL 12.8 05/19/2020 0851   LDLCALC 95 05/19/2020 0851    Physical Exam:    VS:  BP 112/70 (BP Location: Right Arm)   Pulse 72   Ht 5\' 2"  (1.575 m)   Wt 131 lb 1.3 oz (59.5 kg)   SpO2 99%   BMI 23.97 kg/m     Wt Readings from Last 3 Encounters:  06/20/20 131 lb 1.3 oz (59.5 kg)  08/16/19 128 lb  (58.1 kg)  07/28/19 128 lb (58.1 kg)     GEN: Well nourished, well developed in no acute distress HEENT: Normal NECK: No JVD; No carotid bruits LYMPHATICS: No lymphadenopathy CARDIAC: S1S2 noted,RRR, no murmurs, rubs, gallops RESPIRATORY:  Clear to auscultation without rales, wheezing or rhonchi  ABDOMEN: Soft, non-tender, non-distended, +bowel sounds, no guarding. EXTREMITIES: No edema, No cyanosis, no clubbing MUSCULOSKELETAL:  No deformity  SKIN: Warm and dry NEUROLOGIC:  Alert and oriented x 3, non-focal PSYCHIATRIC:  Normal affect, good insight  ASSESSMENT:    1. Palpitations   2. Other chest pain   3. Nonspecific abnormal electrocardiogram (ECG) (EKG)   4. Sinus arrhythmia   5. Chest discomfort    PLAN:     The symptoms chest pain is concerning, this patient does have intermediate risk for coronary artery disease and at this time I  would like to pursue an ischemic evaluation in this patient.  Shared decision a coronary CTA at this time is appropriate.  I have discussed with the patient about the testing.  The patient has no IV contrast allergy and is agreeable to proceed with this test.  I would like to rule out a cardiovascular etiology of this palpitation, therefore at this time I would like to placed a zio patch for 14 days.  Once these testing have been performed amd reviewed further reccomendations will be made. For now, I do reccomend that the patient goes to the nearest ED if  symptoms recur.   The patient is in agreement with the above plan. The patient left the office in stable condition.  The patient will follow up in   Medication Adjustments/Labs and Tests Ordered: Current medicines are reviewed at length with the patient today.  Concerns regarding medicines are outlined above.  Orders Placed This Encounter  Procedures  . CT CORONARY MORPH W/CTA COR W/SCORE W/CA W/CM &/OR WO/CM  . CT CORONARY FRACTIONAL FLOW RESERVE DATA PREP  . CT CORONARY FRACTIONAL FLOW  RESERVE FLUID ANALYSIS  . Basic metabolic panel  . Magnesium  . LONG TERM MONITOR (3-14 DAYS)  . EKG 12-Lead   Meds ordered this encounter  Medications  . metoprolol tartrate (LOPRESSOR) 100 MG tablet    Sig: Take 2 hours prior to CT    Dispense:  1 tablet    Refill:  0    Patient Instructions  Medication Instructions: Your physician recommends that you continue on your current medications as directed. Please refer to the Current Medication list given to you today.  *If you need a refill on your cardiac medications before your next appointment, please call your pharmacy*   Lab Work: Your physician recommends that you return for lab work:  3-7 days before CT: BMET, Mag If you have labs (blood work) drawn today and your tests are completely normal, you will receive your results only by: Marland Kitchen MyChart Message (if you have MyChart) OR . A paper copy in the mail If you have any lab test that is abnormal or we need to change your treatment, we will call you to review the results.   Testing/Procedures: A zio monitor was ordered today. It will remain on for 14 days. You will then return monitor and event diary in provided box. It takes 1-2 weeks for report to be downloaded and returned to [place]. We will call you with the results. If monitor falls off or has orange flashing light, please call Zio for further instructions.   Your cardiac CT will be scheduled at:  [facility] [address] [place], [place] [address] [phone]   If scheduled at [facility], please arrive at the [facility] main entrance (entrance A) of [facility] 30 minutes prior to test start time. Proceed to the [facility] Radiology Department (first floor) to check-in and test prep.   Please follow these instructions carefully (unless otherwise directed):  On the Night Before the Test: . Be sure to Drink plenty of water. . Do not consume any caffeinated/decaffeinated beverages or  chocolate 12 hours prior to your test. . Do not take any antihistamines 12 hours prior to your test.  On the Day of the Test: . Drink plenty of water until 1 hour prior to the test. . Do not eat any food 4 hours prior to the test. . You may take your regular medications prior  to the test.  . Take metoprolol (Lopressor) two hours prior to test. . FEMALES- please wear underwire-free bra if available       After the Test: . Drink plenty of water. . After receiving IV contrast, you may experience a mild flushed feeling. This is normal. . On occasion, you may experience a mild rash up to 24 hours after the test. This is not dangerous. If this occurs, you can take Benadryl 25 mg and increase your fluid intake. . If you experience trouble breathing, this can be serious. If it is severe call 911 IMMEDIATELY. If it is mild, please call our office.   Once we have confirmed authorization from your insurance company, we will call you to set up a date and time for your test. Based on how quickly your insurance processes prior authorizations requests, please allow up to 4 weeks to be contacted for scheduling your Cardiac CT appointment. Be advised that routine Cardiac CT appointments could be scheduled as many as 8 weeks after your provider has ordered it.  For non-scheduling related questions, please contact the cardiac imaging nurse navigator should you have any questions/concerns: Rockwell AlexandriaSara Wallace, Cardiac Imaging Nurse Navigator Larey BrickMerle Prescott, Cardiac Imaging Nurse Navigator [facility] Direct Office Dial: [phone]   For scheduling needs, including cancellations and rescheduling, please call [place], [phone].     Follow-Up: At [facility], you and your health needs are our priority.  As part of our continuing mission to provide you with exceptional heart care, we have created designated Provider Care Teams.  These Care Teams include your primary Cardiologist  (physician) and Advanced Practice Providers (APPs -  Physician Assistants and Nurse Practitioners) who all work together to provide you with the care you need, when you need it.  We recommend signing up for the patient portal called "MyChart".  Sign up information is provided on this After Visit Summary.  MyChart is used to connect with patients for Virtual Visits (Telemedicine).  Patients are able to view lab/test results, encounter notes, upcoming appointments, etc.  Non-urgent messages can be sent to your provider as well.   To learn more about what you can do with MyChart, go to [email or web address].    Your next appointment:   3 month(s)  The format for your next appointment:   In Person  Provider:   Thomasene RippleKardie Cordelro Gautreau, DO   Other Instructions ZIO  WHY IS MY DOCTOR PRESCRIBING ZIO? The Zio system is proven and trusted by physicians to detect and diagnose irregular heart rhythms -- and has been prescribed to hundreds of thousands of patients.  The FDA has cleared the Zio system to monitor for many different kinds of irregular heart rhythms. In a study, physicians were able to reach a diagnosis 90% of the time with the Zio system1.  You can wear the Zio monitor -- a small, discreet, comfortable patch -- during your normal day-to-day activity, including while you sleep, shower, and exercise, while it records every single heartbeat for analysis.  1Barrett, P., et al. Comparison of 24 Hour Holter Monitoring Versus 14 Day Novel Adhesive Patch Electrocardiographic Monitoring. American Journal of Medicine, 2014.  ZIO VS. HOLTER MONITORING The Zio monitor can be comfortably worn for up to 14 days. Holter monitors can be worn for 24 to 48 hours, limiting the time to record any irregular heart rhythms you may have. Zio is able to capture data for the 51% of patients who have their first symptom-triggered arrhythmia after 48 hours.1  LIVE WITHOUT RESTRICTIONS The Zio ambulatory cardiac monitor is  a small, unobtrusive, and water-resistant patch--you might even forget you're wearing it. The Zio monitor records and stores every beat of your heart, whether you're sleeping, working out, or showering. Remove on:  May 3rd 2022     Adopting a Healthy Lifestyle.  Know what a healthy weight is for you (roughly BMI <25) and aim to maintain this   Aim for 7+ servings of fruits and vegetables daily   65-80+ fluid ounces of water or unsweet tea for healthy kidneys   Limit to max 1 drink of alcohol per day; avoid smoking/tobacco   Limit animal fats in diet for cholesterol and heart health - choose grass fed whenever available   Avoid highly processed foods, and foods high in saturated/trans fats   Aim for low stress - take time to unwind and care for your mental health   Aim for 150 min of moderate intensity exercise weekly for heart health, and weights twice weekly for bone health   Aim for 7-9 hours of sleep daily   When it comes to diets, agreement about the perfect plan isnt easy to find, even among the experts. Experts at the [facility] [employer] developed an idea known as the Healthy Eating Plate. Just imagine a plate divided into logical, healthy portions.   The emphasis is on diet quality:   Load up on vegetables and fruits - one-half of your plate: Aim for color and variety, and remember that potatoes dont count.   Go for whole grains - one-quarter of your plate: Whole wheat, barley, wheat berries, quinoa, oats, brown rice, and foods made with them. If you want pasta, go with whole wheat pasta.   Protein power - one-quarter of your plate: Fish, chicken, beans, and nuts are all healthy, versatile protein sources. Limit red meat.   The diet, however, does go beyond the plate, offering a few other suggestions.   Use healthy plant oils, such as olive, canola, soy, corn, sunflower and peanut. Check the labels, and avoid partially hydrogenated oil, which have unhealthy  trans fats.   If youre thirsty, drink water. Coffee and tea are good in moderation, but skip sugary drinks and limit milk and dairy products to one or two daily servings.   The type of carbohydrate in the diet is more important than the amount. Some sources of carbohydrates, such as vegetables, fruits, whole grains, and beans-are healthier than others.   Finally, stay active  Signed, Thomasene Ripple, DO  06/20/2020 6:44 PM    [facility]

## 2020-06-20 NOTE — Patient Instructions (Signed)
Medication Instructions: Your physician recommends that you continue on your current medications as directed. Please refer to the Current Medication list given to you today.  *If you need a refill on your cardiac medications before your next appointment, please call your pharmacy*   Lab Work: Your physician recommends that you return for lab work:  3-7 days before CT: BMET, Mag If you have labs (blood work) drawn today and your tests are completely normal, you will receive your results only by: Marland Kitchen MyChart Message (if you have MyChart) OR . A paper copy in the mail If you have any lab test that is abnormal or we need to change your treatment, we will call you to review the results.   Testing/Procedures: A zio monitor was ordered today. It will remain on for 14 days. You will then return monitor and event diary in provided box. It takes 1-2 weeks for report to be downloaded and returned to [place]. We will call you with the results. If monitor falls off or has orange flashing light, please call Zio for further instructions.   Your cardiac CT will be scheduled at:  [facility] [address] [place], [place] [address] [phone]   If scheduled at [facility], please arrive at the [facility] main entrance (entrance A) of [facility] 30 minutes prior to test start time. Proceed to the [facility] Radiology Department (first floor) to check-in and test prep.   Please follow these instructions carefully (unless otherwise directed):  On the Night Before the Test: . Be sure to Drink plenty of water. . Do not consume any caffeinated/decaffeinated beverages or chocolate 12 hours prior to your test. . Do not take any antihistamines 12 hours prior to your test.  On the Day of the Test: . Drink plenty of water until 1 hour prior to the test. . Do not eat any food 4 hours prior to the test. . You may take your regular medications prior to the test.  . Take  metoprolol (Lopressor) two hours prior to test. . FEMALES- please wear underwire-free bra if available       After the Test: . Drink plenty of water. . After receiving IV contrast, you may experience a mild flushed feeling. This is normal. . On occasion, you may experience a mild rash up to 24 hours after the test. This is not dangerous. If this occurs, you can take Benadryl 25 mg and increase your fluid intake. . If you experience trouble breathing, this can be serious. If it is severe call 911 IMMEDIATELY. If it is mild, please call our office.   Once we have confirmed authorization from your insurance company, we will call you to set up a date and time for your test. Based on how quickly your insurance processes prior authorizations requests, please allow up to 4 weeks to be contacted for scheduling your Cardiac CT appointment. Be advised that routine Cardiac CT appointments could be scheduled as many as 8 weeks after your provider has ordered it.  For non-scheduling related questions, please contact the cardiac imaging nurse navigator should you have any questions/concerns: Rockwell Alexandria, Cardiac Imaging Nurse Navigator Larey Brick, Cardiac Imaging Nurse Navigator  [facility] Direct Office Dial: [phone]   For scheduling needs, including cancellations and rescheduling, please call [place], [phone].     Follow-Up: At [facility], you and your health needs are our priority.  As part of our continuing mission to provide you  with exceptional heart care, we have created designated Provider Care Teams.  These Care Teams include your primary Cardiologist (physician) and Advanced Practice Providers (APPs -  Physician Assistants and Nurse Practitioners) who all work together to provide you with the care you need, when you need it.  We recommend signing up for the patient portal called "MyChart".  Sign up information is provided on this After Visit  Summary.  MyChart is used to connect with patients for Virtual Visits (Telemedicine).  Patients are able to view lab/test results, encounter notes, upcoming appointments, etc.  Non-urgent messages can be sent to your provider as well.   To learn more about what you can do with MyChart, go to [email or web address].    Your next appointment:   3 month(s)  The format for your next appointment:   In Person  Provider:   Thomasene Ripple, DO   Other Instructions ZIO  WHY IS MY DOCTOR PRESCRIBING ZIO? The Zio system is proven and trusted by physicians to detect and diagnose irregular heart rhythms -- and has been prescribed to hundreds of thousands of patients.  The FDA has cleared the Zio system to monitor for many different kinds of irregular heart rhythms. In a study, physicians were able to reach a diagnosis 90% of the time with the Zio system1.  You can wear the Zio monitor -- a small, discreet, comfortable patch -- during your normal day-to-day activity, including while you sleep, shower, and exercise, while it records every single heartbeat for analysis.  1Barrett, P., et al. Comparison of 24 Hour Holter Monitoring Versus 14 Day Novel Adhesive Patch Electrocardiographic Monitoring. American Journal of Medicine, 2014.  ZIO VS. HOLTER MONITORING The Zio monitor can be comfortably worn for up to 14 days. Holter monitors can be worn for 24 to 48 hours, limiting the time to record any irregular heart rhythms you may have. Zio is able to capture data for the 51% of patients who have their first symptom-triggered arrhythmia after 48 hours.1  LIVE WITHOUT RESTRICTIONS The Zio ambulatory cardiac monitor is a small, unobtrusive, and water-resistant patch--you might even forget you're wearing it. The Zio monitor records and stores every beat of your heart, whether you're sleeping, working out, or showering. Remove on:  May 3rd 2022

## 2020-07-04 ENCOUNTER — Other Ambulatory Visit: Payer: Self-pay

## 2020-07-04 ENCOUNTER — Ambulatory Visit (INDEPENDENT_AMBULATORY_CARE_PROVIDER_SITE_OTHER): Payer: 59 | Admitting: Family

## 2020-07-04 ENCOUNTER — Encounter: Payer: Self-pay | Admitting: Family

## 2020-07-04 VITALS — BP 118/60 | HR 83 | Temp 98.4°F | Ht 62.0 in | Wt 130.0 lb

## 2020-07-04 DIAGNOSIS — H6982 Other specified disorders of Eustachian tube, left ear: Secondary | ICD-10-CM

## 2020-07-04 DIAGNOSIS — R300 Dysuria: Secondary | ICD-10-CM

## 2020-07-04 MED ORDER — FLUTICASONE PROPIONATE 50 MCG/ACT NA SUSP: 2.0000 | Freq: Every day | NASAL | 6 refills | Status: DC

## 2020-07-04 NOTE — Patient Instructions (Signed)

## 2020-07-04 NOTE — Progress Notes (Signed)
Carla Watts is a 55 y.o. female with the following history as recorded in [employer]:  Patient Active Problem List   Diagnosis Date Noted  . GERD (gastroesophageal reflux disease)   . Chicken pox   . Allergy   . Irregular menses 05/19/2020  . Acute swimmer's ear of left side 05/19/2020  . Tinnitus of both ears 05/19/2020  . Bilateral breast cysts 08/05/2019  . 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 Family history of breast cancer   . Family history of ovarian cancer   . Family history of prostate cancer   . Family history of brain cancer   . Vitamin D deficiency 11/25/2014  . Palpitations 11/24/2014    Current Outpatient Medications  Medication Sig Dispense Refill  . Ascorbic Acid (VITAMIN C) 100 MG tablet Take 100 mg by mouth daily.    . Cholecalciferol (VITAMIN D-3) 1000 units CAPS Take 1,000 Units by mouth.    . fluticasone (FLONASE) 50 MCG/ACT nasal spray Place 2 sprays into both nostrils daily. 16 g 6  . Multiple Vitamin (MULTIVITAMIN) capsule Take 1 capsule by mouth daily.    . Omega-3 Fatty Acids (FISH OIL ADULT GUMMIES PO) Take 1 tablet by mouth daily.    Marland Kitchen ELDERBERRY PO Take 1 tablet by mouth daily. (Patient not taking: Reported on 07/04/2020)    . metoprolol tartrate (LOPRESSOR) 100 MG tablet Take 2 hours prior to CT (Patient not taking: Reported on 07/04/2020) 1 tablet 0   No current facility-administered medications for this visit.    Allergies: Meloxicam and Penicillins  Past Medical History:  Diagnosis Date  . Allergy    mild   . Chicken pox   . GERD (gastroesophageal reflux disease)   . Palpitations    comes and goes     Past Surgical History:  Procedure Laterality Date  . CERVICAL BIOPSY  W/ LOOP ELECTRODE EXCISION    . CESAREAN SECTION    . laparoscopy of uterus    . OOPHORECTOMY Right 2002   dermoid cyst    Family History  Problem Relation Age of Onset  . Hypertension Mother   . Thyroid disease Mother   . Breast  cancer Mother 83  . Skin cancer Mother   . Stroke Father   . Skin cancer Father   . Heart attack Maternal Grandfather   . Heart disease Maternal Grandfather 37       Died of MI  . Arthritis Maternal Grandmother        RA  . Thyroid disease Maternal Grandmother   . Alcohol abuse Paternal Grandfather   . Ovarian cancer Paternal Aunt   . Brain cancer Paternal Uncle   . Thyroid disease Maternal Aunt   . Colon cancer Neg Hx   . Colon polyps Neg Hx   . Esophageal cancer Neg Hx   . Rectal cancer Neg Hx   . Stomach cancer Neg Hx     Social History   Tobacco Use  . Smoking status: Never Smoker  . Smokeless tobacco: Never Used  . Tobacco comment: in college smoked a few cigs at a party   Substance Use Topics  . Alcohol use: Yes    Comment: socially    Subjective:   Patient presents with concerns for sensation that left ear pain is "popping" for the past 2 weeks; denies any pain or hearing loss; no sore throat or cough; no history of seasonal allergies;  Also would like to re-check urine sample  to make sure that UTI from mid-March has cleared completely; had some urinary frequency last week and was concerned that infection has recurred;     Objective:  Vitals:   07/04/20 0824  BP: 118/60  Pulse: 83  Temp: 98.4 F (36.9 C)  TempSrc: Oral  SpO2: 99%  Weight: 130 lb (59 kg)  Height: _0  (1.575 m)    General: Well developed, well nourished, in no acute distress  Skin : Warm and dry.  Head: Normocephalic and atraumatic  Eyes: Sclera and conjunctiva clear; pupils round and reactive to light; extraocular movements intact  Ears: External normal; canals clear; tympanic membranes normal  Oropharynx: Pink, supple. No suspicious lesions  Neck: Supple without thyromegaly, adenopathy  Lungs: Respirations unlabored;  Neurologic: Alert and oriented; speech intact; face symmetrical; moves all extremities well; CNII-XII intact without focal deficit   Assessment:  1. Dysuria   2.  Dysfunction of left eustachian tube     Plan:  1. Check urine culture to ensure complete resolution of recent UTI; 2. Rx for Flonase NS- use as directed; follow up with her PCP if symptoms persist.  This visit occurred during the SARS-CoV-2 public health emergency.  Safety protocols were in place, including screening questions prior to the visit, additional usage of staff PPE, and extensive cleaning of exam room while observing appropriate contact time as indicated for disinfecting solutions.     No follow-ups on file.  Orders Placed This Encounter  Procedures  . Urine Culture    Standing Status:   Future    Standing Expiration Date:   07/04/2021    Requested Prescriptions   Signed Prescriptions Disp Refills  . fluticasone (FLONASE) 50 MCG/ACT nasal spray 16 g 6    Sig: Place 2 sprays into both nostrils daily.

## 2020-07-05 LAB — URINE CULTURE
MICRO NUMBER:: 11843411
Result:: NO GROWTH
SPECIMEN QUALITY:: ADEQUATE

## 2020-08-09 ENCOUNTER — Telehealth: Payer: Self-pay

## 2020-08-09 NOTE — Telephone Encounter (Signed)
Tried calling patient. No answer and no voicemail set up for me to leave a message. 

## 2020-08-09 NOTE — Telephone Encounter (Signed)
-----   Message from Kardie Tobb, DO sent at 07/10/2020 10:28 AM EDT ----- You did have some episodes of fast heart rates that went up to the 150s.  There were only 5 but based on the trigger you felt some of them.  If they are still bothersome we can discuss use of medications.  But if they are tolerable then we will continue to observe you clinically. 

## 2020-08-10 ENCOUNTER — Telehealth: Payer: Self-pay

## 2020-08-10 NOTE — Telephone Encounter (Signed)
Tried calling patient. No answer and no voicemail set up for me to leave a message.  Letter mailed to patient at this time.  

## 2020-08-10 NOTE — Telephone Encounter (Signed)
-----   Message from Thomasene Ripple, DO sent at 07/10/2020 10:28 AM EDT ----- You did have some episodes of fast heart rates that went up to the 150s.  There were only 5 but based on the trigger you felt some of them.  If they are still bothersome we can discuss use of medications.  But if they are tolerable then we will continue to observe you clinically.

## 2020-09-18 ENCOUNTER — Telehealth (HOSPITAL_COMMUNITY): Payer: Self-pay | Admitting: *Deleted

## 2020-09-18 NOTE — Telephone Encounter (Signed)
Attempted to call patient regarding upcoming cardiac CT appointment. °Left message on voicemail with name and callback number ° °Lamari Beckles RN Navigator Cardiac Imaging °[facility] °[phone] Office °[phone] Cell ° °

## 2020-09-19 ENCOUNTER — Telehealth (HOSPITAL_COMMUNITY): Payer: Self-pay | Admitting: *Deleted

## 2020-09-19 NOTE — Telephone Encounter (Signed)
Reaching out to patient to offer assistance regarding upcoming cardiac imaging study; pt verbalizes understanding of appt date/time, parking situation and where to check in, pre-test NPO status and medications ordered, and verified current allergies; name and call back number provided for further questions should they arise  Larey Brick RN Navigator Cardiac Imaging [facility] [phone] office [phone] cell  Patient had concerns about metoprolol dropping her BP too much and states resting HR is around 65bpm.  Pt advised to take 50mg  metoprolol tartrate if HR was 65bpm two hours prior to cardiac CT scan but made patient aware that HR control was critical to scan and that Dr. recommends taking 100mg  metoprolol tartrate.  Patient verbalized understanding and is anxious about test overall.

## 2020-09-20 ENCOUNTER — Other Ambulatory Visit: Payer: Self-pay

## 2020-09-20 ENCOUNTER — Encounter (HOSPITAL_COMMUNITY): Payer: Self-pay

## 2020-09-20 ENCOUNTER — Telehealth: Payer: Self-pay

## 2020-09-20 ENCOUNTER — Ambulatory Visit (HOSPITAL_COMMUNITY)
Admission: RE | Admit: 2020-09-20 | Discharge: 2020-09-20 | Disposition: A | Discharge status: Home / Self Care | Payer: BLUE CROSS/BLUE SHIELD | Source: Ambulatory Visit | Attending: Cardiology | Admitting: Cardiology

## 2020-09-20 DIAGNOSIS — R0789 Other chest pain: Secondary | ICD-10-CM | POA: Diagnosis not present

## 2020-09-20 MED ORDER — NITROGLYCERIN 0.4 MG SL SUBL
SUBLINGUAL_TABLET | SUBLINGUAL | Status: AC
  Filled 2020-09-20: qty 2

## 2020-09-20 MED ORDER — IOHEXOL 350 MG/ML SOLN
100.0000 mL | Freq: Once | INTRAVENOUS | Status: AC | PRN
  Administered 2020-09-20: 100 mL via INTRAVENOUS

## 2020-09-20 MED ORDER — NITROGLYCERIN 0.4 MG SL SUBL
0.8000 mg | SUBLINGUAL_TABLET | Freq: Once | SUBLINGUAL | Status: AC
  Administered 2020-09-20: 0.4 mg via SUBLINGUAL

## 2020-09-20 NOTE — Telephone Encounter (Signed)
-----   Message from Baldo Daub, MD sent at 09/20/2020  2:20 PM EDT ----- Cardiac CTA is normal

## 2020-09-20 NOTE — Telephone Encounter (Signed)
Left message on patients voicemail to please return our call.   

## 2020-09-21 ENCOUNTER — Telehealth: Payer: Self-pay

## 2020-09-21 NOTE — Telephone Encounter (Signed)
-----   Message from Brian J Munley, MD sent at 09/20/2020  2:20 PM EDT ----- Cardiac CTA is normal 

## 2020-09-21 NOTE — Telephone Encounter (Signed)
Spoke with patient regarding results and recommendation.  Patient verbalizes understanding and is agreeable to plan of care. Advised patient to call back with any issues or concerns.  

## 2020-09-21 NOTE — Telephone Encounter (Signed)
Left message on patients voicemail to please return our call.   

## 2020-10-05 ENCOUNTER — Encounter: Payer: Self-pay | Admitting: Cardiology

## 2020-10-05 ENCOUNTER — Other Ambulatory Visit: Payer: Self-pay

## 2020-10-05 ENCOUNTER — Ambulatory Visit (INDEPENDENT_AMBULATORY_CARE_PROVIDER_SITE_OTHER): Payer: 59 | Admitting: Cardiology

## 2020-10-05 VITALS — BP 102/70 | HR 70 | Ht 62.0 in | Wt 133.0 lb

## 2020-10-05 DIAGNOSIS — I471 Supraventricular tachycardia: Secondary | ICD-10-CM | POA: Diagnosis not present

## 2020-10-05 NOTE — Patient Instructions (Signed)
Medication Instructions:  Your physician recommends that you continue on your current medications as directed. Please refer to the Current Medication list given to you today.  *If you need a refill on your cardiac medications before your next appointment, please call your pharmacy*   Lab Work: None If you have labs (blood work) drawn today and your tests are completely normal, you will receive your results only by: MyChart Message (if you have MyChart) OR A paper copy in the mail If you have any lab test that is abnormal or we need to change your treatment, we will call you to review the results.   Testing/Procedures: None   Follow-Up: At [facility], you and your health needs are our priority.  As part of our continuing mission to provide you with exceptional heart care, we have created designated Provider Care Teams.  These Care Teams include your primary Cardiologist (physician) and Advanced Practice Providers (APPs -  Physician Assistants and Nurse Practitioners) who all work together to provide you with the care you need, when you need it.  We recommend signing up for the patient portal called "MyChart".  Sign up information is provided on this After Visit Summary.  MyChart is used to connect with patients for Virtual Visits (Telemedicine).  Patients are able to view lab/test results, encounter notes, upcoming appointments, etc.  Non-urgent messages can be sent to your provider as well.   To learn more about what you can do with MyChart, go to [email or web address].    Your next appointment:   As needed  The format for your next appointment:   In Person  Provider:   [facility] [address] - Kardie Tobb, DO [address], [place], [place] [address]    Other Instructions   

## 2020-10-05 NOTE — Progress Notes (Signed)
Cardiology Office Note:    Date:  10/05/2020   ID:  Carla Watts, DOB [birth year], MRN [id number]  PCP:  Natalia Leatherwood, DO  Cardiologist:  Thomasene Ripple, DO  Electrophysiologist:  None   Referring MD: Natalia Leatherwood, DO   Chief Complaint  Patient presents with   Results  I am doing fine  History of Present Illness:    Carla Watts is a 55 y.o. female with a hx of GERD, gestational diabetes is here today for follow-up visit.  First saw the patient on June 20, 2020 at that time she was experiencing chest pain as well as palpitations.  I recommend she undergo a coronary CTA and also placed a monitor on the patient.  She was able to get this testing done. She is here today to discuss the testing results.  Past Medical History:  Diagnosis Date   Allergy    mild    Chicken pox    GERD (gastroesophageal reflux disease)    Palpitations    comes and goes     Past Surgical History:  Procedure Laterality Date   CERVICAL BIOPSY  W/ LOOP ELECTRODE EXCISION     CESAREAN SECTION     laparoscopy of uterus     OOPHORECTOMY Right 2002   dermoid cyst    Current Medications: Current Meds  Medication Sig   Ascorbic Acid (VITAMIN C) 100 MG tablet Take 100 mg by mouth daily.   Cholecalciferol (VITAMIN D-3) 1000 units CAPS Take 1,000 Units by mouth daily.   ELDERBERRY PO Take 1 tablet by mouth every other day. Unknown strength   fluticasone (FLONASE) 50 MCG/ACT nasal spray Place 2 sprays into both nostrils daily.   Multiple Vitamin (MULTIVITAMIN) capsule Take 1 capsule by mouth daily. Unknown strenght   Omega-3 Fatty Acids (FISH OIL ADULT GUMMIES PO) Take 1 tablet by mouth daily. Unknown strength     Allergies:   Meloxicam and Penicillins   Social History   Socioeconomic History   Marital status: Married    Spouse name: Not on file   Number of children: Not on file   Years of education: Not on file   Highest education level: Not on file  Occupational History    Occupation: health fitness coach    Comment: yoga instructor  Tobacco Use   Smoking status: Never   Smokeless tobacco: Never   Tobacco comments:    in college smoked a few cigs at a party   Vaping Use   Vaping Use: Never used  Substance and Sexual Activity   Alcohol use: Yes    Comment: socially   Drug use: No   Sexual activity: Yes    Comment:  Vasectomy-1st intercourse 55yo-Fewer than 5 partners  Other Topics Concern   Not on file  Social History Narrative   Married, 3 children.   - drinks some caffeine.   - uses herbal remedies at time.    - Wears a seatbelt, exercises 3x/w, smoke alarm in the home   - Guns in the home, locked case   - Feels safe in relationship   Social Determinants of Health   Financial Resource Strain: Not on file  Food Insecurity: Not on file  Transportation Needs: Not on file  Physical Activity: Not on file  Stress: Not on file  Social Connections: Not on file     Family History: The patient's family history includes Alcohol abuse in her paternal grandfather; Arthritis in her maternal grandmother; Brain cancer  in her paternal uncle; Breast cancer (age of onset: 61) in her mother; Heart attack in her maternal grandfather; Heart disease (age of onset: 68) in her maternal grandfather; Hypertension in her mother; Ovarian cancer in her paternal aunt; Skin cancer in her father and mother; Stroke in her father; Thyroid disease in her maternal aunt, maternal grandmother, and mother. There is no history of Colon cancer, Colon polyps, Esophageal cancer, Rectal cancer, or Stomach cancer.  ROS:   Review of Systems  Constitution: Negative for decreased appetite, fever and weight gain.  HENT: Negative for congestion, ear discharge, hoarse voice and sore throat.   Eyes: Negative for discharge, redness, vision loss in right eye and visual halos.  Cardiovascular: Negative for chest pain, dyspnea on exertion, leg swelling, orthopnea and palpitations.  Respiratory:  Negative for cough, hemoptysis, shortness of breath and snoring.   Endocrine: Negative for heat intolerance and polyphagia.  Hematologic/Lymphatic: Negative for bleeding problem. Does not bruise/bleed easily.  Skin: Negative for flushing, nail changes, rash and suspicious lesions.  Musculoskeletal: Negative for arthritis, joint pain, muscle cramps, myalgias, neck pain and stiffness.  Gastrointestinal: Negative for abdominal pain, bowel incontinence, diarrhea and excessive appetite.  Genitourinary: Negative for decreased libido, genital sores and incomplete emptying.  Neurological: Negative for brief paralysis, focal weakness, headaches and loss of balance.  Psychiatric/Behavioral: Negative for altered mental status, depression and suicidal ideas.  Allergic/Immunologic: Negative for HIV exposure and persistent infections.    EKGs/Labs/Other Studies Reviewed:    The following studies were reviewed today:   EKG: None today  Coronary CT scan September 28, 2020 FINDINGS: Quality: Good, HR 61   Coronary calcium score: The patient's coronary artery calcium score is 0, which places the patient in the 0 percentile.   Coronary arteries: Normal coronary origins.  Right dominance.   Right Coronary Artery: Dominant. Normal vessel. Normal R-PDA and R-PLB branches.   Left Main Coronary Artery: Normal. Trifurcates into the LAD, RI and LCx arteries.   Left Anterior Descending Coronary Artery: Moderate size anterior vessel that reaches the apex. There is a mid-diagonal branch without disease.   Ramus Intermedius: Small branch, no disease.   Left Circumflex Artery: AV groove vessel without disease.   Aorta: Normal size, 23 mm at the mid ascending aorta (level of the PA bifurcation) measured double oblique. No calcifications. No dissection.   Aortic Valve: Trileaflet.  No calcifications.   Other findings:   Normal pulmonary vein drainage into the left atrium.   Normal left atrial appendage  without a thrombus.   Normal size of the pulmonary artery.   IMPRESSION: 1. No evidence of CAD, CADRADS = 0.   2. Coronary calcium score of 0. This was 0 percentile for age and sex matched control.   3. Normal coronary origin with right dominance.   4. Consider non-coronary causes of chest pain.    ZIO monitor Patch Wear Time:  14 days and 0 hours starting June 20, 2020.   Indication:  Palpitations   Patient had a minimum HR of 33 bpm, maximum HR of 176 bpm, and average HR of 81 bpm.   Predominant underlying rhythm was Sinus Rhythm.   5 Supraventricular Tachycardia runs occurred, the run with the fastest interval lasting 16 beats with a maximum rate of 152 bpm, the longest lasting 18.8 secs with an average rate of 123 bpm.   Second Degree AV Block-Mobitz I (Wenckebach) was present.   Premature atrial complexes were rare. Premature ventricular complexes were rare.   23  patient triggered events: 9 were associated with supraventricular tachycardia and the remaining were associated with sinus tachycardia and premature atrial complexes.  14 diary events mostly associated with Supraventricular tachycardia.   No ventricular tachycardia, No pauses or atrial fibrillation noted.   Conclusion: This study is remarkable for Supraventricular tachycardia which is likely atrial tachycardia with variable block.    Recent Labs: 05/19/2020: ALT 9; BUN 15; Creatinine, Ser 0.70; Hemoglobin 13.6; Platelets 249.0; Potassium 4.3; Sodium 139; TSH 1.57  Recent Lipid Panel    Component Value Date/Time   CHOL 189 05/19/2020 0851   TRIG 64.0 05/19/2020 0851   HDL 81.10 05/19/2020 0851   CHOLHDL 2 05/19/2020 0851   VLDL 12.8 05/19/2020 0851   LDLCALC 95 05/19/2020 0851    Physical Exam:    VS:  BP 102/70 (BP Location: Right Arm, Patient Position: Sitting)   Pulse 70   Ht 5\' 2"  (1.575 m)   Wt 133 lb (60.3 kg)   SpO2 97%   BMI 24.33 kg/m     Wt Readings from Last 3 Encounters:  10/05/20  133 lb (60.3 kg)  07/04/20 130 lb (59 kg)  06/20/20 131 lb 1.3 oz (59.5 kg)     GEN: Well nourished, well developed in no acute distress HEENT: Normal NECK: No JVD; No carotid bruits LYMPHATICS: No lymphadenopathy CARDIAC: S1S2 noted,RRR, no murmurs, rubs, gallops RESPIRATORY:  Clear to auscultation without rales, wheezing or rhonchi  ABDOMEN: Soft, non-tender, non-distended, +bowel sounds, no guarding. EXTREMITIES: No edema, No cyanosis, no clubbing MUSCULOSKELETAL:  No deformity  SKIN: Warm and dry NEUROLOGIC:  Alert and oriented x 3, non-focal PSYCHIATRIC:  Normal affect, good insight  ASSESSMENT:    1. Paroxysmal SVT (supraventricular tachycardia) (HCC)    PLAN:     Discussed for her testing results.  I did advise the patient of the evidence of paroxysmal SVT which were rare.  For now we will continue to monitor.  I have encouraged the patient to cut back on any stimulants like caffeine.  Also review her coronary CT scan we did not show any evidence of coronary artery disease.  The patient is in agreement with the above plan. The patient left the office in stable condition.  The patient will follow up as needed.   Medication Adjustments/Labs and Tests Ordered: Current medicines are reviewed at length with the patient today.  Concerns regarding medicines are outlined above.  No orders of the defined types were placed in this encounter.  No orders of the defined types were placed in this encounter.   Patient Instructions  Medication Instructions:  Your physician recommends that you continue on your current medications as directed. Please refer to the Current Medication list given to you today.  *If you need a refill on your cardiac medications before your next appointment, please call your pharmacy*   Lab Work: None If you have labs (blood work) drawn today and your tests are completely normal, you will receive your results only by: MyChart Message (if you have  MyChart) OR A paper copy in the mail If you have any lab test that is abnormal or we need to change your treatment, we will call you to review the results.   Testing/Procedures: None   Follow-Up: [facility], you and your health needs are our priority.  As part of our continuing mission to provide you with exceptional heart care, we have created designated Provider Care Teams.  These Care Teams include your primary Cardiologist (physician) and Advanced  Practice Providers (APPs -  Physician Assistants and Nurse Practitioners) who all work together to provide you with the care you need, when you need it.  We recommend signing up for the patient portal called "MyChart".  Sign up information is provided on this After Visit Summary.  MyChart is used to connect with patients for Virtual Visits (Telemedicine).  Patients are able to view lab/test results, encounter notes, upcoming appointments, etc.  Non-urgent messages can be sent to your provider as well.   To learn more about what you can do with MyChart, go to [email or web address].    Your next appointment:   As needed  The format for your next appointment:   In Person  Provider:   [facility] - Thomasene Ripple, DO [address], [place], [place] [address]   Other Instructions    Adopting a Healthy Lifestyle.  Know what a healthy weight is for you (roughly BMI <25) and aim to maintain this   Aim for 7+ servings of fruits and vegetables daily   65-80+ fluid ounces of water or unsweet tea for healthy kidneys   Limit to max 1 drink of alcohol per day; avoid smoking/tobacco   Limit animal fats in diet for cholesterol and heart health - choose grass fed whenever available   Avoid highly processed foods, and foods high in saturated/trans fats   Aim for low stress - take time to unwind and care for your mental health   Aim for 150 min of moderate intensity exercise weekly for heart health, and weights twice weekly for  bone health   Aim for 7-9 hours of sleep daily   When it comes to diets, agreement about the perfect plan isnt easy to find, even among the experts. Experts at the [facility] of [employer] developed an idea known as the Healthy Eating Plate. Just imagine a plate divided into logical, healthy portions.   The emphasis is on diet quality:   Load up on vegetables and fruits - one-half of your plate: Aim for color and variety, and remember that potatoes dont count.   Go for whole grains - one-quarter of your plate: Whole wheat, barley, wheat berries, quinoa, oats, brown rice, and foods made with them. If you want pasta, go with whole wheat pasta.   Protein power - one-quarter of your plate: Fish, chicken, beans, and nuts are all healthy, versatile protein sources. Limit red meat.   The diet, however, does go beyond the plate, offering a few other suggestions.   Use healthy plant oils, such as olive, canola, soy, corn, sunflower and peanut. Check the labels, and avoid partially hydrogenated oil, which have unhealthy trans fats.   If youre thirsty, drink water. Coffee and tea are good in moderation, but skip sugary drinks and limit milk and dairy products to one or two daily servings.   The type of carbohydrate in the diet is more important than the amount. Some sources of carbohydrates, such as vegetables, fruits, whole grains, and beans-are healthier than others.   Finally, stay active  Signed, Thomasene Ripple, DO  10/05/2020 8:58 PM    [facility]

## 2020-11-18 ENCOUNTER — Other Ambulatory Visit: Payer: Self-pay

## 2020-11-18 ENCOUNTER — Encounter (HOSPITAL_BASED_OUTPATIENT_CLINIC_OR_DEPARTMENT_OTHER): Payer: Self-pay | Admitting: Emergency Medicine

## 2020-11-18 ENCOUNTER — Emergency Department (HOSPITAL_BASED_OUTPATIENT_CLINIC_OR_DEPARTMENT_OTHER): Payer: BC Managed Care – PPO

## 2020-11-18 ENCOUNTER — Emergency Department (HOSPITAL_BASED_OUTPATIENT_CLINIC_OR_DEPARTMENT_OTHER)
Admission: EM | Admit: 2020-11-18 | Discharge: 2020-11-18 | Disposition: A | Discharge status: Home / Self Care | Payer: BC Managed Care – PPO | Attending: Emergency Medicine | Admitting: Emergency Medicine

## 2020-11-18 DIAGNOSIS — N83201 Unspecified ovarian cyst, right side: Secondary | ICD-10-CM | POA: Diagnosis not present

## 2020-11-18 DIAGNOSIS — N83202 Unspecified ovarian cyst, left side: Secondary | ICD-10-CM | POA: Diagnosis not present

## 2020-11-18 DIAGNOSIS — N939 Abnormal uterine and vaginal bleeding, unspecified: Secondary | ICD-10-CM | POA: Diagnosis not present

## 2020-11-18 LAB — COMPREHENSIVE METABOLIC PANEL
ALT: 13 U/L (ref 0–44)
AST: 19 U/L (ref 15–41)
Albumin: 4.3 g/dL (ref 3.5–5.0)
Alkaline Phosphatase: 52 U/L (ref 38–126)
Anion gap: 6 (ref 5–15)
BUN: 15 mg/dL (ref 6–20)
CO2: 26 mmol/L (ref 22–32)
Calcium: 8.8 mg/dL — ABNORMAL LOW (ref 8.9–10.3)
Chloride: 104 mmol/L (ref 98–111)
Creatinine, Ser: 0.68 mg/dL (ref 0.44–1.00)
GFR, Estimated: >60 mL/min (ref >60)
Glucose, Bld: 96 mg/dL (ref 70–99)
Potassium: 3.6 mmol/L (ref 3.5–5.1)
Sodium: 136 mmol/L (ref 135–145)
Total Bilirubin: 0.3 mg/dL (ref 0.3–1.2)
Total Protein: 7.2 g/dL (ref 6.5–8.1)

## 2020-11-18 LAB — URINALYSIS, ROUTINE W REFLEX MICROSCOPIC
Bilirubin Urine: NEGATIVE
Glucose, UA: NEGATIVE mg/dL
Ketones, ur: NEGATIVE mg/dL
Leukocytes,Ua: NEGATIVE
Nitrite: NEGATIVE
Protein, ur: NEGATIVE mg/dL
Specific Gravity, Urine: 1.015 (ref 1.005–1.030)
pH: 6 (ref 5.0–8.0)

## 2020-11-18 LAB — WET PREP, GENITAL
Clue Cells Wet Prep HPF POC: NONE SEEN
Sperm: NONE SEEN
Trich, Wet Prep: NONE SEEN
Yeast Wet Prep HPF POC: NONE SEEN

## 2020-11-18 LAB — CBC
HCT: 40.9 % (ref 36.0–46.0)
Hemoglobin: 13.8 g/dL (ref 12.0–15.0)
MCH: 29.8 pg (ref 26.0–34.0)
MCHC: 33.7 g/dL (ref 30.0–36.0)
MCV: 88.3 fL (ref 80.0–100.0)
Platelets: 282 10*3/uL (ref 150–400)
RBC: 4.63 MIL/uL (ref 3.87–5.11)
RDW: 12.6 % (ref 11.5–15.5)
WBC: 10.3 10*3/uL (ref 4.0–10.5)
nRBC: 0 % (ref 0.0–0.2)

## 2020-11-18 LAB — URINALYSIS, MICROSCOPIC (REFLEX)

## 2020-11-18 LAB — PREGNANCY, URINE: Preg Test, Ur: NEGATIVE

## 2020-11-18 NOTE — ED Notes (Signed)
Pt ambulatory to waiting room. Pt verbalized understanding of discharge instructions.   

## 2020-11-18 NOTE — ED Triage Notes (Signed)
Pt reports heavy menstrual bleeding that started on Thursday. Pt reports being lightheaded this morning. Denis dizziness. Denies pain.

## 2020-11-18 NOTE — ED Notes (Signed)
Patient transported to [facility] 

## 2020-11-18 NOTE — Discharge Instructions (Signed)
Please read and follow all provided instructions.  Your diagnoses today include:  1. Episode of heavy vaginal bleeding   2. Vaginal bleeding   3. Cyst of left ovary     Tests performed today include: Blood cell counts (white, red, and platelets) Electrolytes  Kidney function test Urine test to check for infection Ultrasound -shows normal-appearing uterus with normal endometrial thickness, shows a left-sided ovarian cyst Vital signs. See below for your results today.   Medications prescribed:  None  Take any prescribed medications only as directed.  Home care instructions:  Follow any educational materials contained in this packet.  BE VERY CAREFUL not to take multiple medicines containing Tylenol (also called acetaminophen). Doing so can lead to an overdose which can damage your liver and cause liver failure and possibly death.   Follow-up instructions: Please follow-up with your OB/GYN in the next week for further evaluation.  Call Monday for an appointment.  Return instructions:  Please return to the Emergency Department if you experience worsening symptoms.  Return with severe bleeding or if you have significant lightheadedness, passing out, shortness of breath Please return if you have any other emergent concerns.  Additional Information:  Your vital signs today were: BP 107/71 (BP Location: Right Arm)   Pulse 63   Temp 98.3 F (36.8 C) (Oral)   Resp 16   SpO2 100%  If your blood pressure (BP) was elevated above 135/85 this visit, please have this repeated by your doctor within one month. --------------

## 2020-11-18 NOTE — ED Provider Notes (Signed)
[facility] EMERGENCY DEPARTMENT Provider Note   CSN: [id number] Arrival date & time: 11/18/20  1332     History Chief Complaint  Patient presents with   Vaginal Bleeding    Carla Watts is a 55 y.o. female.  Patient presents the emergency department for evaluation of heavy vaginal bleeding starting 2 days ago.  Patient is perimenopausal.  Her last menstrual period was in May 2022.  Patient developed heavy bleeding without pain or cramping.  She states that today she has been changing her menstrual cup every 2 hours.  She felt mildly lightheaded today but no syncope.  No chest pain or shortness of breath.  No history of bleeding problems.  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  She also had a LEEP procedure in her 19s.  She is followed up once last year with [facility], Dr. Dellis Filbert. The onset of this condition was acute. The course is constant. Aggravating factors: none. Alleviating factors: none.        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   Family history of breast cancer    Family history of ovarian cancer    Family history of prostate cancer    Family history of brain cancer    Vitamin D deficiency 11/25/2014   Palpitations 11/24/2014    Past Surgical History:  Procedure Laterality Date   CERVICAL BIOPSY  W/ LOOP ELECTRODE EXCISION     CESAREAN SECTION     laparoscopy of uterus     OOPHORECTOMY Right 2002   dermoid cyst     OB History     Gravida  5   Para   3   Term      Preterm      AB  2   Living  3      SAB  2   IAB      Ectopic      Multiple      Live Births              Family History  Problem Relation Age of Onset   Hypertension Mother    Thyroid disease Mother    Breast cancer Mother 70   Skin cancer Mother    Stroke Father    Skin cancer Father    Heart attack Maternal Grandfather    Heart disease Maternal Grandfather 90       Died of MI   Arthritis Maternal Grandmother        RA   Thyroid disease Maternal Grandmother    Alcohol abuse Paternal Grandfather    Ovarian cancer Paternal Aunt    Brain cancer Paternal Uncle    Thyroid disease Maternal Aunt    Colon cancer Neg Hx    Colon polyps Neg Hx    Esophageal cancer Neg Hx    Rectal cancer Neg Hx    Stomach cancer Neg  Hx     Social History   Tobacco Use   Smoking status: Never   Smokeless tobacco: Never   Tobacco comments:    in college smoked a few cigs at a party   Vaping Use   Vaping Use: Never used  Substance Use Topics   Alcohol use: Yes    Comment: socially   Drug use: No    Home Medications Prior to Admission medications   Medication Sig Start Date End Date Taking? Authorizing Provider  Ascorbic Acid (VITAMIN C) 100 MG tablet Take 100 mg by mouth daily.    [provider]  Cholecalciferol (VITAMIN D-3) 1000 units CAPS Take 1,000 Units by mouth daily.    [provider]  ELDERBERRY PO Take 1 tablet by mouth every other day. Unknown strength    [provider]  fluticasone (FLONASE) 50 MCG/ACT nasal spray Place 2 sprays into both nostrils daily. 07/04/20   Marrian Salvage, FNP  Multiple Vitamin (MULTIVITAMIN) capsule Take 1 capsule by mouth daily. Unknown strenght    [provider]  Omega-3 Fatty Acids (FISH OIL ADULT GUMMIES PO) Take 1 tablet by mouth daily. Unknown strength    [provider]    Allergies    Meloxicam and Penicillins  Review of Systems   Review of Systems   Constitutional:  Negative for fever.  HENT:  Negative for rhinorrhea and sore throat.   Eyes:  Negative for redness.  Respiratory:  Negative for cough.   Cardiovascular:  Negative for chest pain.  Gastrointestinal:  Negative for abdominal pain, diarrhea, nausea and vomiting.  Genitourinary:  Positive for vaginal bleeding. Negative for dysuria, frequency, hematuria and urgency.  Musculoskeletal:  Negative for myalgias.  Skin:  Negative for rash.  Neurological:  Positive for light-headedness. Negative for headaches.   Physical Exam Updated Vital Signs BP 109/63 (BP Location: Right Arm)   Pulse 66   Temp 98.3 F (36.8 C) (Oral)   Resp 18   SpO2 100%   Physical Exam Vitals and nursing note reviewed. Exam conducted with a chaperone present.  Constitutional:      General: She is not in acute distress.    Appearance: She is well-developed.  HENT:     Head: Normocephalic and atraumatic.     Right Ear: External ear normal.     Left Ear: External ear normal.     Nose: Nose normal.  Eyes:     Conjunctiva/sclera: Conjunctivae normal.  Cardiovascular:     Rate and Rhythm: Normal rate and regular rhythm.     Heart sounds: No murmur heard. Pulmonary:     Effort: No respiratory distress.     Breath sounds: No wheezing, rhonchi or rales.  Abdominal:     Palpations: Abdomen is soft.     Tenderness: There is no abdominal tenderness. There is no guarding or rebound.     Comments: No abdominal pain.   Genitourinary:    Exam position: Lithotomy position.     Labia:        Right: No lesion.        Left: No lesion.      Vagina: Bleeding (Dark blood in vaginal vault) present. No vaginal discharge or tenderness.     Cervix: Normal.     Adnexa:        Right: No mass, tenderness or fullness.         Left: No mass, tenderness or fullness.       Comments: Dark blood coming  from cervix, mild to moderate in amount, no clotting Musculoskeletal:     Cervical back: Normal range of motion and  neck supple.     Right lower leg: No edema.     Left lower leg: No edema.  Skin:    General: Skin is warm and dry.     Findings: No rash.  Neurological:     General: No focal deficit present.     Mental Status: She is alert. Mental status is at baseline.     Motor: No weakness.  Psychiatric:        Mood and Affect: Mood normal.    ED Results / Procedures / Treatments   Labs (all labs ordered are listed, but only abnormal results are displayed) Labs Reviewed  COMPREHENSIVE METABOLIC PANEL - Abnormal; Notable for the following components:      Result Value   Calcium 8.8 (*)    All other components within normal limits  WET PREP, GENITAL  CBC  PREGNANCY, URINE  URINALYSIS, ROUTINE W REFLEX MICROSCOPIC  GC/CHLAMYDIA PROBE AMP () NOT AT [facility]    EKG None  Radiology No results found.  Procedures Procedures   Medications Ordered in ED Medications - No data to display  ED Course  I have reviewed the triage vital signs and the nursing notes.  Pertinent labs & imaging results that were available during my care of the patient were reviewed by me and considered in my medical decision making (see chart for details).  Patient seen and examined. Work-up initiated.  Discussed labs and further work-up at bedside.  This includes pelvic exam with ultrasound here versus follow-up with OB/GYN.  Patient is seen [facility] GYN in the past.  Patient would prefer to proceed with further evaluation given lightheadedness and abnormal heavy bleeding.  Vital signs reviewed and are as follows: BP 109/63 (BP Location: Right Arm)   Pulse 66   Temp 98.3 F (36.8 C) (Oral)   Resp 18   SpO2 100%   Pelvic exam performed with NP chaperone.  Ultrasound pending.  5:30 PM patient and family updated on ultrasound and other results.  Plan for OB/GYN follow-up.  She will continue close monitoring at home.  Discussed signs and symptoms of worsening anemia which would require her to return  to the emergency department or follow-up urgently.  She verbalizes understanding agrees with plan.  BP 107/71 (BP Location: Right Arm)   Pulse 63   Temp 98.3 F (36.8 C) (Oral)   Resp 16   SpO2 100%       MDM Rules/Calculators/A&P                           Patient with heavy perimenopausal bleeding.  Normal hemoglobin.  Vital signs within normal limits.  Pelvic exam with mild to moderate amount of dark red blood in the vaginal vault without clots.  No significant tenderness or pain.  Ultrasound shows normal-appearing uterus with no concerning findings of the endometrium.  She does have a left-sided ovarian cyst.  At this point, stable for discharge to home with OBGYN follow-up.  No indication for emergent consultation or admission.   Final Clinical Impression(s) / ED Diagnoses Final diagnoses:  Episode of heavy vaginal bleeding  Cyst of left ovary    Rx / DC Orders ED Discharge Orders     None        Carlisle Cater, PA-C 11/18/20 1731    Hayden Rasmussen,  MD 11/19/20 1024

## 2020-11-20 ENCOUNTER — Telehealth: Payer: Self-pay

## 2020-11-20 LAB — GC/CHLAMYDIA PROBE AMP (~~LOC~~) NOT AT ARMC
Chlamydia: NEGATIVE
Comment: NEGATIVE
Comment: NORMAL
Neisseria Gonorrhea: NEGATIVE

## 2020-11-20 NOTE — Telephone Encounter (Signed)
FYI- Pt was evaluated at [facility] ED 9/17  [facility] Day - Client TELEPHONE ADVICE RECORD AccessNurse Patient Name: Carla Watts Gender: Female DOB: 1965-08-21 Age: 55 Y 14 D Return Phone Number: [phone] (Primary) Address:City/ [place]/ [place] [address] Client [facility] Day - Client Client Site [facility] - Day Physician [place], [place] Contact Type Call Who Is Calling Patient / Member / Family / Caregiver Call Type Triage / Clinical Relationship To Patient Self Return Phone Number [phone] (Primary) Chief Complaint Menstrual Period, Missed or Late Reason for Call Symptomatic / Request for Health Information Initial Comment Caller states in the past 24 hours she has had to change her menstrual cup every 2 hours. Is concerned and wants to know what to do. Translation No Nurse Assessment Nurse: Loistine Simas, RN, Lannette Donath Date/Time (Eastern Time): 11/18/2020 12:58:27 PM Confirm and document reason for call. If symptomatic, describe symptoms. ---Caller states in the past 24 hours she has had to change her menstrual cup every 2 hours. Is concerned and wants to know what to do. Does the patient have any new or worsening symptoms? ---Yes Will a triage be completed? ---Yes Related visit to physician within the last 2 weeks? ---No Does the PT have any chronic conditions? (i.e. diabetes, asthma, this includes High risk factors for pregnancy, etc.) ---No Is the patient pregnant or possibly pregnant? (Ask all females between the ages of 68-55) ---No Is this a behavioral health or substance abuse call? ---No Guidelines Guideline Title Affirmed Question Affirmed Notes Nurse Date/Time (Eastern Time) Vaginal Bleeding - Abnormal SEVERE vaginal bleeding (e.g., soaking 2 pads or tampons per hour and present 2 or more hours; 1 menstrual cup every 2 hours) Hearon, RN, Lannette Donath 11/18/2020 1:01:24 PM Disp. Time  Lamount Cohen Time) Disposition Final User PLEASE NOTE: All timestamps contained within this report are represented as [place] Standard Time. CONFIDENTIALTY NOTICE: This fax transmission is intended only for the addressee. It contains information that is legally privileged, confidential or otherwise protected from use or disclosure. If you are not the intended recipient, you are strictly prohibited from reviewing, disclosing, copying using or disseminating any of this information or taking any action in reliance on or regarding this information. If you have received this fax in error, please notify us immediately by telephone so that we can arrange for its return to [place]. Phone: [phone], Toll-Free: [phone], Fax: [phone] Page: 2 of 2 Call Id: [id number] 11/18/2020 1:05:10 PM Go to ED Now (or PCP triage) Yes Loistine Simas, RN, Roe Rutherford Disagree/Comply Comply Caller Understands Yes PreDisposition Did not know what to do Care Advice Given Per Guideline CARE ADVICE given per Vaginal Bleeding, Abnormal (Adult) guideline. * IF NO PCP (PRIMARY CARE PROVIDER) SECOND-LEVEL TRIAGE: You need to be seen within the next hour. Go to the ED/UCC at _____________ Hospital. Leave as soon as you can. GO TO ED NOW (OR PCP TRIAGE): Referrals GO TO FACILITY UNDECIDED

## 2021-02-15 DIAGNOSIS — M9902 Segmental and somatic dysfunction of thoracic region: Secondary | ICD-10-CM | POA: Diagnosis not present

## 2021-02-15 DIAGNOSIS — M9901 Segmental and somatic dysfunction of cervical region: Secondary | ICD-10-CM | POA: Diagnosis not present

## 2021-02-15 DIAGNOSIS — M791 Myalgia, unspecified site: Secondary | ICD-10-CM | POA: Diagnosis not present

## 2021-02-15 DIAGNOSIS — M531 Cervicobrachial syndrome: Secondary | ICD-10-CM | POA: Diagnosis not present

## 2021-03-09 ENCOUNTER — Encounter (HOSPITAL_COMMUNITY): Payer: Self-pay | Admitting: Radiology

## 2021-04-30 ENCOUNTER — Other Ambulatory Visit: Payer: Self-pay | Admitting: Family

## 2022-01-03 ENCOUNTER — Ambulatory Visit: Payer: PRIVATE HEALTH INSURANCE | Admitting: Family Medicine

## 2022-01-03 VITALS — BP 100/70 | HR 82 | Temp 98.6°F | Wt 124.2 lb

## 2022-01-03 DIAGNOSIS — H00011 Hordeolum externum right upper eyelid: Secondary | ICD-10-CM

## 2022-01-03 MED ORDER — ERYTHROMYCIN 5 MG/GM OP OINT: 1.0000 | TOPICAL_OINTMENT | Freq: Every day | OPHTHALMIC | 0 refills | Status: AC

## 2022-01-03 NOTE — Patient Instructions (Signed)
No follow-ups on file.        Great to see you today.  I have refilled the medication(s) we provide.   If labs were collected, we will inform you of lab results once received either by echart message or telephone call.   - echart message- for normal results that have been seen by the patient already.   - telephone call: abnormal results or if patient has not viewed results in their echart.   Stye A stye, also known as a hordeolum, is a bump that forms on an eyelid. It may look like a pimple next to the eyelash. A stye can form inside the eyelid (internal stye) or outside the eyelid (external stye). A stye can cause redness, swelling, and pain on the eyelid. Styes are very common. Anyone can get them at any age. They usually occur in just one eye at a time, but you may have more than one in either eye. What are the causes? A stye is caused by an infection. The infection is almost always caused by bacteria called Staphylococcus aureus. This is a common type of bacteria that lives on the skin. An internal stye may result from an infected oil-producing gland inside the eyelid. An external stye may be caused by an infection at the base of the eyelash (hair follicle). What increases the risk? You are more likely to develop a stye if: You have had a stye before. You have any of these conditions: Red, itchy, inflamed eyelids (blepharitis). A skin condition such as seborrheic dermatitis or rosacea. High fat levels in your blood (lipids). Dry eyes. What are the signs or symptoms? The most common symptom of a stye is eyelid pain. Internal styes are more painful than external styes. Other symptoms may include: Painful swelling of your eyelid. A scratchy feeling in your eye. Tearing and redness of your eye. A pimple-like bump on the edge of the eyelid. Pus draining from the stye. How is this diagnosed? Your health care provider may be able to diagnose a stye just by examining your eye. The  health care provider may also check to make sure: You do not have a fever or other signs of a more serious infection. The infection has not spread to other parts of your eye or areas around your eye. How is this treated? Most styes will clear up in a few days without treatment or with warm compresses applied to the area. You may need to use antibiotic drops or ointment to treat an infection. Sometimes, steroid drops or ointment are used in addition to antibiotics. In some cases, your health care provider may give you a small steroid injection in the eyelid. If your stye does not heal with routine treatment, your health care provider may drain pus from the stye using a thin blade or needle. This may be done if the stye is large, causing a lot of pain, or affecting your vision. Follow these instructions at home: Take over-the-counter and prescription medicines only as told by your health care provider. This includes eye drops or ointments. If you were prescribed an antibiotic medicine, steroid medicine, or both, apply or use them as told by your health care provider. Do not stop using the medicine even if your condition improves. Apply a warm, wet cloth (warm compress) to your eye for 5-10 minutes, 4 to 6 times a day. Clean the affected eyelid as directed by your health care provider. Do not wear contact lenses or eye makeup until your  stye has healed and your health care provider says that it is safe. Do not try to pop or drain the stye. Do not rub your eye. Contact a health care provider if: You have chills or a fever. Your stye does not go away after several days. Your stye affects your vision. Your eyeball becomes swollen, red, or painful. Get help right away if: You have pain when moving your eye around. Summary A stye is a bump that forms on an eyelid. It may look like a pimple next to the eyelash. A stye can form inside the eyelid (internal stye) or outside the eyelid (external stye). A  stye can cause redness, swelling, and pain on the eyelid. Your health care provider may be able to diagnose a stye just by examining your eye. Apply a warm, wet cloth (warm compress) to your eye for 5-10 minutes, 4 to 6 times a day. This information is not intended to replace advice given to you by your health care provider. Make sure you discuss any questions you have with your health care provider. Document Revised: 04/26/2020 Document Reviewed: 04/26/2020 Elsevier Patient Education  [place].

## 2022-01-03 NOTE — Progress Notes (Signed)
Carla Watts , 1965-06-19, 56 y.o., female MRN: [id number] Patient Care Team    Relationship Specialty Notifications Start End  Ma Hillock, DO PCP - General Family Medicine  11/24/14   Berniece Salines, DO PCP - Cardiology Cardiology  06/20/20   Princess Bruins, MD Consulting Physician Obstetrics and Gynecology  07/02/19   Thornton Park, MD Consulting Physician Gastroenterology  05/19/20   Silverio Decamp, MD Consulting Physician Sports Medicine  05/19/20     Chief Complaint  Patient presents with   Stye    Right eye x1 week. Washed with baby shampoo      Subjective: Pt presents for an OV with complaints of right eye pain  of 1 week duration.  Associated symptoms include stye formation on her upper mid  lid.   Pt has tried gentle cleanse with babyshampoo to ease their symptoms.      01/03/2022    8:35 AM 07/04/2020    8:24 AM 05/15/2020    4:01 PM 07/01/2019   11:11 AM 02/27/2018    8:33 AM  Depression screen PHQ 2/9  Decreased Interest 0 0 0 0 0  Down, Depressed, Hopeless 0 0 0 0 1  PHQ - 2 Score 0 0 0 0 1  Altered sleeping     1  Tired, decreased energy     1  Change in appetite     2  Feeling bad or failure about yourself      0  Trouble concentrating     0  Moving slowly or fidgety/restless     0  Suicidal thoughts     0  PHQ-9 Score     5  Difficult doing work/chores     Not difficult at all    Allergies  Allergen Reactions   Meloxicam Rash   Penicillins Rash   Social History   Social History Narrative   Married, 3 children.   - drinks some caffeine.   - uses herbal remedies at time.    - Wears a seatbelt, exercises 3x/w, smoke alarm in the home   - Guns in the home, locked case   - Feels safe in relationship   Past Medical History:  Diagnosis Date   Allergy    mild    Chicken pox    GERD (gastroesophageal reflux disease)    Palpitations    comes and goes    Past Surgical History:  Procedure Laterality Date   CERVICAL  BIOPSY  W/ LOOP ELECTRODE EXCISION     CESAREAN SECTION     laparoscopy of uterus     OOPHORECTOMY Right 2002   dermoid cyst   Family History  Problem Relation Age of Onset   Hypertension Mother    Thyroid disease Mother    Breast cancer Mother 31   Skin cancer Mother    Stroke Father    Skin cancer Father    Heart attack Maternal Grandfather    Heart disease Maternal Grandfather 63       Died of MI   Arthritis Maternal Grandmother        RA   Thyroid disease Maternal Grandmother    Alcohol abuse Paternal Grandfather    Ovarian cancer Paternal Aunt    Brain cancer Paternal Uncle    Thyroid disease Maternal Aunt    Colon cancer Neg Hx    Colon polyps Neg Hx    Esophageal cancer Neg Hx    Rectal cancer Neg  Hx    Stomach cancer Neg Hx    Allergies as of 01/03/2022       Reactions   Meloxicam Rash   Penicillins Rash        Medication List        Accurate as of January 03, 2022  8:42 AM. If you have any questions, ask your nurse or doctor.          ELDERBERRY PO Take 1 tablet by mouth every other day. Unknown strength   erythromycin ophthalmic ointment Place 1 Application into the right eye at bedtime for 7 days.   FISH OIL ADULT GUMMIES PO Take 1 tablet by mouth daily. Unknown strength   fluticasone 50 MCG/ACT nasal spray Commonly known as: FLONASE SPRAY 2 SPRAYS INTO EACH NOSTRIL EVERY DAY   multivitamin capsule Take 1 capsule by mouth daily. Unknown strenght   vitamin C 100 MG tablet Take 100 mg by mouth daily.   Vitamin D-3 25 MCG (1000 UT) Caps Take 1,000 Units by mouth daily.        All past medical history, surgical history, allergies, family history, immunizations andmedications were updated in the EMR today and reviewed under the history and medication portions of their EMR.     ROS Negative, with the exception of above mentioned in HPI   Objective:  BP 100/70   Pulse 82   Temp 98.6 F (37 C)   Wt 124 lb 3.2 oz (56.3 kg)   SpO2  98%   BMI 22.72 kg/m  Body mass index is 22.72 kg/m. Physical Exam Vitals and nursing note reviewed.  Constitutional:      General: She is not in acute distress.    Appearance: Normal appearance. She is normal weight. She is not ill-appearing or toxic-appearing.  HENT:     Head: Normocephalic and atraumatic.  Eyes:     General:        Right eye: Hordeolum present. No foreign body or discharge.        Left eye: No foreign body, discharge or hordeolum.     Extraocular Movements: Extraocular movements intact.     Conjunctiva/sclera: Conjunctivae normal.     Pupils: Pupils are equal, round, and reactive to light.   Neurological:     Mental Status: She is alert and oriented to person, place, and time. Mental status is at baseline.  Psychiatric:        Mood and Affect: Mood normal.        Behavior: Behavior normal.        Thought Content: Thought content normal.        Judgment: Judgment normal.      No results found. No results found. No results found for this or any previous visit (from the past 24 hour(s)).  Assessment/Plan: Carla Watts is a 56 y.o. female present for OV for  Hordeolum externum of right upper eyelid - warm compresses and Jj shampoo cleanse - erythromycin op oint qhs x7 days - f/u prn  Reviewed expectations re: course of current medical issues. Discussed self-management of symptoms. Outlined signs and symptoms indicating need for more acute intervention. Patient verbalized understanding and all questions were answered. Patient received an After-Visit Summary.    No orders of the defined types were placed in this encounter.  Meds ordered this encounter  Medications   erythromycin ophthalmic ointment    Sig: Place 1 Application into the right eye at bedtime for 7 days.    Dispense:  3.5  g    Refill:  0   Referral Orders  No referral(s) requested today     Note is dictated utilizing voice recognition software. Although note has been  proof read prior to signing, occasional typographical errors still can be missed. If any questions arise, please do not hesitate to call for verification.   electronically signed by:  Felix Pacini, DO  [facility] [place]

## 2022-01-18 ENCOUNTER — Ambulatory Visit (HOSPITAL_BASED_OUTPATIENT_CLINIC_OR_DEPARTMENT_OTHER)
Admission: RE | Admit: 2022-01-18 | Discharge: 2022-01-18 | Disposition: A | Discharge status: Home / Self Care | Payer: PRIVATE HEALTH INSURANCE | Source: Ambulatory Visit | Attending: Family Medicine | Admitting: Family Medicine

## 2022-01-18 ENCOUNTER — Other Ambulatory Visit (HOSPITAL_BASED_OUTPATIENT_CLINIC_OR_DEPARTMENT_OTHER): Payer: Self-pay

## 2022-01-18 DIAGNOSIS — Z1231 Encounter for screening mammogram for malignant neoplasm of breast: Secondary | ICD-10-CM | POA: Diagnosis present

## 2022-05-03 IMAGING — CT CT HEART MORP W/ CTA COR W/ SCORE W/ CA W/CM &/OR W/O CM
4 of 7 series · 8 of 20 positions shown, 9 images · IV contrast (APPLIED)
Comparison: None.
COMPARISON: None.

Addendum:
EXAM:
OVER-READ INTERPRETATION  CT CHEST

The following report is an over-read performed by radiologist Dr.
over-read does not include interpretation of cardiac or coronary
anatomy or pathology. The Coronary CTA interpretation by the
cardiologist is attached.
HISTORY: 54 yo female wtih chest pain/anginal equiv, ECGs or troponins
abnormal
Cardiac/Coronary CTA
TECHNIQUE: The patient was scanned on a Siemens Force scanner.
PROTOCOL: A 110 kV prospective scan was triggered in the descending thoracic
aorta at 111 HU's. Axial non-contrast 3 mm slices were carried out
through the heart. The data set was analyzed on a dedicated work
station and scored using the Agatson method. Gantry rotation speed
was 250 msecs and collimation was .6 mm. Beta blockade and 0.8 mg of
sl NTG was given. The 3D data set was reconstructed in 5% intervals
of the 35-75 % of the R-R cycle. Diastolic phases were analyzed on a
dedicated work station using MPR, MIP and VRT modes. The patient
received 100mL OMNIPAQUE IOHEXOL 350 MG/ML SOLN of contrast.

[Series 7: best diast 74 % · axial · 0.34mm/px · z∈[+1191,+1236]mm · 2 of 341 slices shown, 3 images]
[im 114/341  vessel]
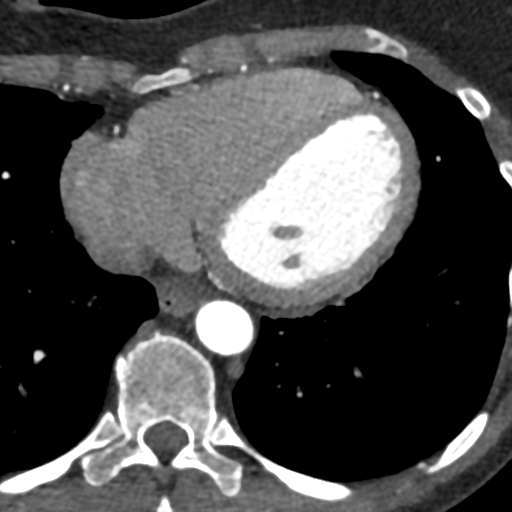
[im 114/341  lung]
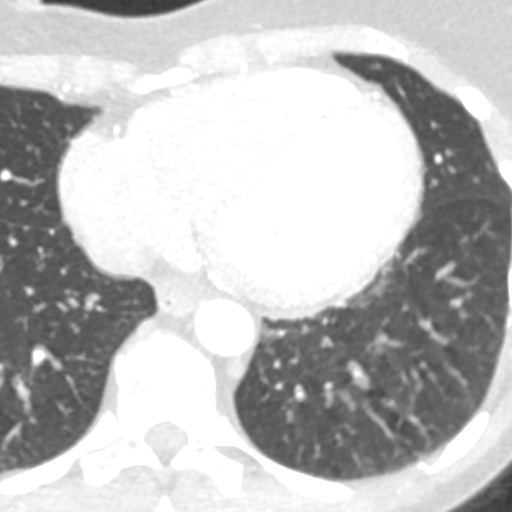
[im 227/341  vessel]
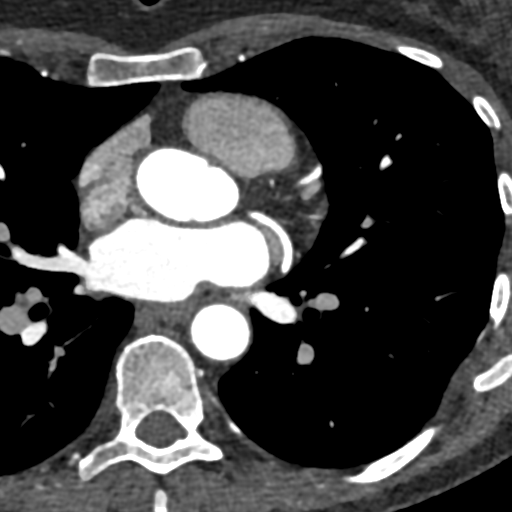

[Series 8: best syst 36 % · axial · 0.34mm/px · z∈[+1191,+1236]mm · 2 of 341 slices shown]
[im 114/341  vessel]
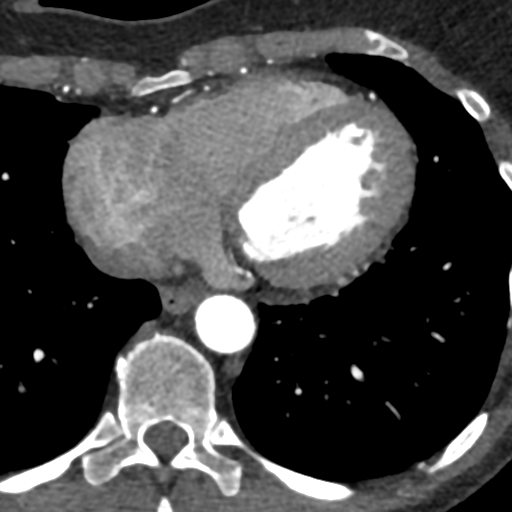
[im 227/341  vessel]
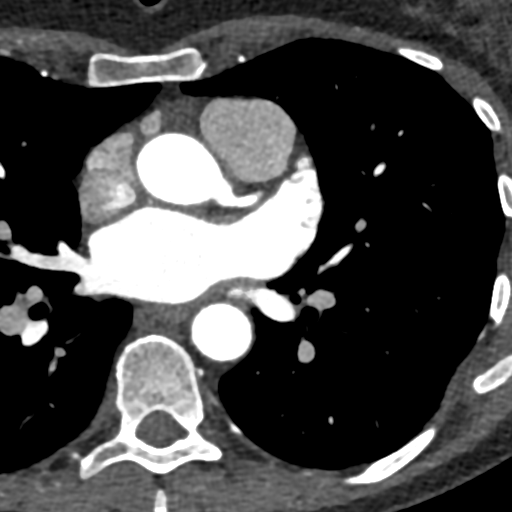

[Series 9: ts diast sharp 74 % · axial · 0.34mm/px · z∈[+1191,+1236]mm · 2 of 341 slices shown]
[im 114/341  lung]
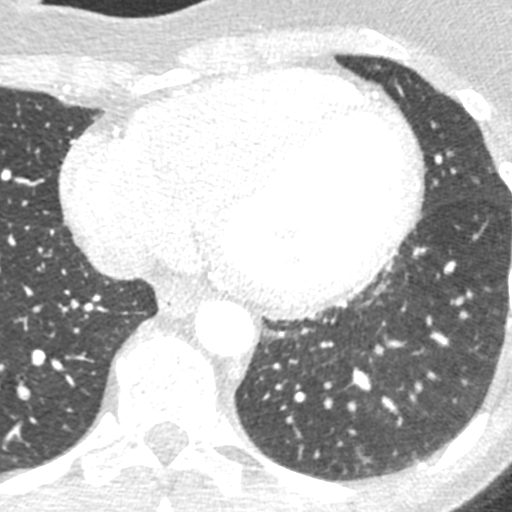
[im 227/341  lung]
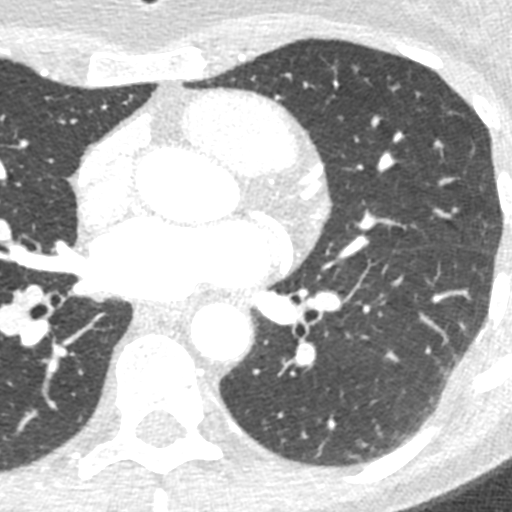

[Series 10: ts syst sharp 36 % · axial · 0.34mm/px · z∈[+1191,+1236]mm · 2 of 341 slices shown]
[im 114/341  lung]
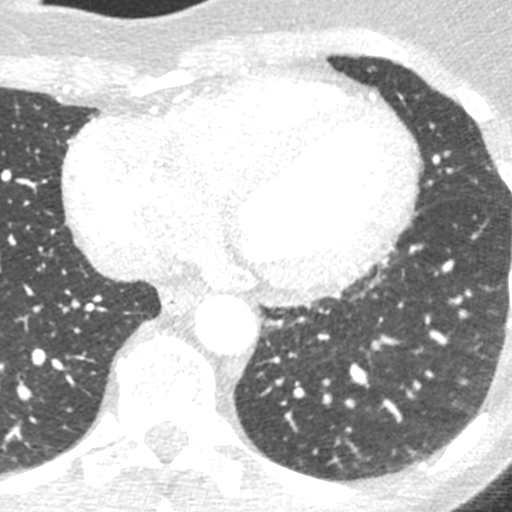
[im 227/341  lung]
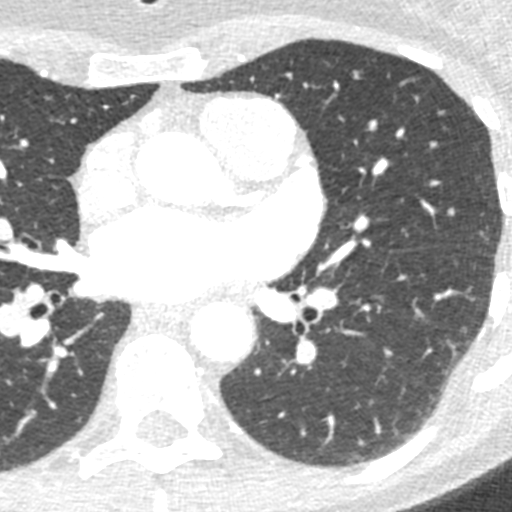

[8 of 20 positions shown; findings below may reference images not displayed]

FINDINGS: Vascular: No significant extracardiac vascular findings.

Mediastinum/Nodes: Visualized portions of the mediastinum and hilar
regions are unremarkable.

Lungs/Pleura: No suspicious pulmonary nodule or mass. No
consolidation.

Upper Abdomen: Visualized portions of the hepatic and splenic
parenchyma are unremarkable.

Musculoskeletal: No worrisome lytic or sclerotic osseous
abnormality.
IMPRESSION: No acute or clinically significant extracardiac findings.
FINDINGS: Quality: Good, HR 61

Coronary calcium score: The patient's coronary artery calcium score
is 0, which places the patient in the 0 percentile.

Coronary arteries: Normal coronary origins.  Right dominance.

Right Coronary Artery: Dominant. Normal vessel. Normal R-PDA and
R-PLB branches.

Left Main Coronary Artery: Normal. Trifurcates into the LAD, RI and
LCx arteries.

Left Anterior Descending Coronary Artery: Moderate size anterior
vessel that reaches the apex. There is a mid-diagonal branch without
disease.

Ramus Intermedius: Small branch, no disease.

Left Circumflex Artery: AV groove vessel without disease.

Aorta: Normal size, 23 mm at the mid ascending aorta (level of the
PA bifurcation) measured double oblique. No calcifications. No
dissection.

Aortic Valve: Trileaflet.  No calcifications.

Other findings:

Normal pulmonary vein drainage into the left atrium.

Normal left atrial appendage without a thrombus.

Normal size of the pulmonary artery.
IMPRESSION: 1. No evidence of CAD, CADRADS = 0.

2. Coronary calcium score of 0. This was 0 percentile for age and
sex matched control.

3. Normal coronary origin with right dominance.

4. Consider non-coronary causes of chest pain.

*** End of Addendum ***
EXAM:
OVER-READ INTERPRETATION  CT CHEST

The following report is an over-read performed by radiologist Dr.
over-read does not include interpretation of cardiac or coronary
anatomy or pathology. The Coronary CTA interpretation by the
cardiologist is attached.
FINDINGS: Vascular: No significant extracardiac vascular findings.

Mediastinum/Nodes: Visualized portions of the mediastinum and hilar
regions are unremarkable.

Lungs/Pleura: No suspicious pulmonary nodule or mass. No
consolidation.

Upper Abdomen: Visualized portions of the hepatic and splenic
parenchyma are unremarkable.

Musculoskeletal: No worrisome lytic or sclerotic osseous
abnormality.
IMPRESSION: No acute or clinically significant extracardiac findings.

## 2022-05-29 ENCOUNTER — Ambulatory Visit: Payer: PRIVATE HEALTH INSURANCE | Admitting: Obstetrics & Gynecology

## 2022-05-29 ENCOUNTER — Other Ambulatory Visit (HOSPITAL_COMMUNITY)
Admission: RE | Admit: 2022-05-29 | Discharge: 2022-05-29 | Disposition: A | Discharge status: Home / Self Care | Payer: PRIVATE HEALTH INSURANCE | Source: Ambulatory Visit | Attending: Obstetrics & Gynecology | Admitting: Obstetrics & Gynecology

## 2022-05-29 ENCOUNTER — Encounter: Payer: Self-pay | Admitting: Obstetrics & Gynecology

## 2022-05-29 VITALS — BP 118/76 | HR 86 | Ht 61.75 in | Wt 127.0 lb

## 2022-05-29 DIAGNOSIS — N914 Secondary oligomenorrhea: Secondary | ICD-10-CM | POA: Diagnosis not present

## 2022-05-29 DIAGNOSIS — N951 Menopausal and female climacteric states: Secondary | ICD-10-CM

## 2022-05-29 DIAGNOSIS — N83202 Unspecified ovarian cyst, left side: Secondary | ICD-10-CM | POA: Diagnosis not present

## 2022-05-29 DIAGNOSIS — Z87440 Personal history of urinary (tract) infections: Secondary | ICD-10-CM

## 2022-05-29 DIAGNOSIS — Z01419 Encounter for gynecological examination (general) (routine) without abnormal findings: Secondary | ICD-10-CM

## 2022-05-29 MED ORDER — NORETHINDRONE 0.35 MG PO TABS: 1.0000 | ORAL_TABLET | Freq: Every day | ORAL | 4 refills | Status: DC

## 2022-05-29 NOTE — Progress Notes (Signed)
Carla Watts [facility] [birth year] [id number]   History:    57 y.o. G5P3A2L3 Married.  Husband vasectomized   RP:  New (>3 yrs) patient presenting for annual gyn exam    HPI: S/P Rt Oophorectomy for Dermoid in 2002. Perimenopause with oligomenorrhea about every 6 months.  LMP 05/24/2022 was light x 2 days.  No pelvic pain.  Normal vaginal secretions. Pap 07/2018 Neg.  No h/o abnormal Pap.  Pap reflex today. No pain with IC.  Vasectomy.  Declines STI screen.  Urine/BMs normal.  Breasts normal.  Mammo Neg 01/2022.  BMI 23.42.  Healthy nutrition, good fitness.  Health labs with Fam MD. Harriet Masson 08/2019.    Past medical history,surgical history, family history and social history were all reviewed and documented in the EPIC chart.  Gynecologic History Patient's last menstrual period was 05/24/2022.  Obstetric History OB History  Gravida Para Term Preterm AB Living  5 3 2 1 2 3   SAB IAB Ectopic Multiple Live Births  2            # Outcome Date GA Lbr Len/2nd Weight Sex Delivery Anes PTL Lv  5 SAB           4 SAB           3 Preterm           2 Term           1 Term              ROS: A ROS was performed and pertinent positives and negatives are included in the history. GENERAL: No fevers or chills. HEENT: No change in vision, no earache, sore throat or sinus congestion. NECK: No pain or stiffness. CARDIOVASCULAR: No chest pain or pressure. No palpitations. PULMONARY: No shortness of breath, cough or wheeze. GASTROINTESTINAL: No abdominal pain, nausea, vomiting or diarrhea, melena or bright red blood per rectum. GENITOURINARY: No urinary frequency, urgency, hesitancy or dysuria. MUSCULOSKELETAL: No joint or muscle pain, no back pain, no recent trauma. DERMATOLOGIC: No rash, no itching, no lesions. ENDOCRINE: No polyuria, polydipsia, no heat or cold intolerance. No recent change in weight. HEMATOLOGICAL: No anemia or easy bruising or bleeding. NEUROLOGIC: No headache, seizures, numbness, tingling or  weakness. PSYCHIATRIC: No depression, no loss of interest in normal activity or change in sleep pattern.     Exam:   BP 118/76   Pulse 86   Ht 5' 1.75" (1.568 m)   Wt 127 lb (57.6 kg)   LMP 05/24/2022 Comment: spotting for 2 days on friday & saturday  SpO2 97%   BMI 23.42 kg/m   Body mass index is 23.42 kg/m.  General appearance : Well developed well nourished female. No acute distress HEENT: Eyes: no retinal hemorrhage or exudates,  Neck supple, trachea midline, no carotid bruits, no thyroidmegaly Lungs: Clear to auscultation, no rhonchi or wheezes, or rib retractions  Heart: Regular rate and rhythm, no murmurs or gallops Breast:Examined in sitting and supine position were symmetrical in appearance, no palpable masses or tenderness,  no skin retraction, no nipple inversion, no nipple discharge, no skin discoloration, no axillary or supraclavicular lymphadenopathy Abdomen: no palpable masses or tenderness, no rebound or guarding Extremities: no edema or skin discoloration or tenderness  Pelvic: Vulva: Normal             Vagina: No gross lesions or discharge  Cervix: No gross lesions or discharge.  Pap reflex done.  Uterus  AV, normal size, shape and consistency, non-tender  and mobile  Adnexa  Without masses or tenderness  Anus: Normal  Pelvic US 11/2020:  Uterus Measurements: Anteverted uterus measures 9.7 x 5.3 x 5 4 cm. = volume: 147 mL. No fibroids or other mass visualized. Endometrium Thickness: 9 mm.  Heterogeneous echotexture of the endometrium. Right ovary Measurements: Surgically absent  Left ovary Measurements: 5.2 x 2.9 x 4.5 cm.  = volume: 36 mL. Within the ovary there is a 4.7 x 3.2 x 4.0 cm anechoic lesion with minimal eccentric internal echoes. No flow noted within this cystic like structure. Small volume free fluid.  U/A: Dark yellow clear, Prtn tr, Nit Neg, WBC Neg, RBC 3-10, Bact Neg.  Pending U. Culture.   Assessment/Plan:  57 y.o. female for annual  exam   1. Encounter for routine gynecological examination with Papanicolaou smear of cervix S/P Rt Oophorectomy for Dermoid in 2002. Perimenopause with oligomenorrhea about every 6 months.  LMP 05/24/2022 was light x 2 days.  No pelvic pain.  Normal vaginal secretions. Pap 07/2018 Neg.  No h/o abnormal Pap.  Pap reflex today. No pain with IC.  Vasectomy.  Declines STI screen.  Urine/BMs normal.  Breasts normal.  Mammo Neg 01/2022.  BMI 23.42.  Healthy nutrition, good fitness.  Health labs with Fam MD. Harriet Masson 08/2019. - Cytology - PAP( [place])  2. Perimenopause Will check menopausal status today with an [place]. - [facility]  3. Secondary oligomenorrhea Perimenopause with oligomenorrhea about every 6 months.  LMP 05/24/2022 was light x 2 days.  R/O abnormal uterine bleeding with Pelvic US at f/u.  Will start on the Progestin only pill to protect the endometrium. - US Transvaginal Non-OB; Future  4. Cyst of left ovary Left Ovarian cyst on [place] 11/2020, repeat Pelvic US to assess the left ovary. - US Transvaginal Non-OB; Future  5. History of UTI U/A with mild abnormalities, will wait on U. Culture. - Urinalysis,Complete w/RFL Culture  Other orders - Urine Culture - REFLEXIVE URINE CULTURE - norethindrone (MICRONOR) 0.35 MG tablet; Take 1 tablet (0.35 mg total) by mouth daily.   Princess Bruins MD, 8:46 AM

## 2022-05-30 LAB — FOLLICLE STIMULATING HORMONE: FSH: 68.5 m[IU]/mL

## 2022-05-31 LAB — URINE CULTURE
MICRO NUMBER:: 14748561
SPECIMEN QUALITY:: ADEQUATE

## 2022-05-31 LAB — URINALYSIS, COMPLETE W/RFL CULTURE
Bacteria, UA: NONE SEEN /HPF
Bilirubin Urine: NEGATIVE
Casts: NONE SEEN /LPF
Crystals: NONE SEEN /HPF
Glucose, UA: NEGATIVE
Hyaline Cast: NONE SEEN /LPF
Leukocyte Esterase: NEGATIVE
Nitrites, Initial: NEGATIVE
Specific Gravity, Urine: 1.025 (ref 1.001–1.035)
WBC, UA: NONE SEEN /HPF (ref 0–5)
Yeast: NONE SEEN /HPF
pH: 5.5 (ref 5.0–8.0)

## 2022-05-31 LAB — CYTOLOGY - PAP
Diagnosis: NEGATIVE
Diagnosis: REACTIVE

## 2022-05-31 LAB — CULTURE INDICATED

## 2022-06-14 ENCOUNTER — Telehealth (INDEPENDENT_AMBULATORY_CARE_PROVIDER_SITE_OTHER): Payer: PRIVATE HEALTH INSURANCE | Admitting: Family Medicine

## 2022-06-14 DIAGNOSIS — R42 Dizziness and giddiness: Secondary | ICD-10-CM | POA: Diagnosis not present

## 2022-06-14 MED ORDER — ONDANSETRON 4 MG PO TBDP: 4.0000 mg | ORAL_TABLET | Freq: Three times a day (TID) | ORAL | 0 refills | Status: DC | PRN

## 2022-06-14 NOTE — Telephone Encounter (Signed)
Patient presented today with her daughter for an appointment.  During appointment patient requested Zofran for nausea related to her vertigo. I agreed to call in a short-term prescription, if she is requesting any additional refills she will need an appointment to fully evaluate.

## 2022-06-21 ENCOUNTER — Other Ambulatory Visit: Payer: Self-pay | Admitting: Family Medicine

## 2022-08-15 ENCOUNTER — Encounter: Payer: Self-pay | Admitting: Obstetrics & Gynecology

## 2022-08-15 ENCOUNTER — Ambulatory Visit (INDEPENDENT_AMBULATORY_CARE_PROVIDER_SITE_OTHER): Payer: PRIVATE HEALTH INSURANCE

## 2022-08-15 ENCOUNTER — Ambulatory Visit (INDEPENDENT_AMBULATORY_CARE_PROVIDER_SITE_OTHER): Payer: PRIVATE HEALTH INSURANCE | Admitting: Obstetrics & Gynecology

## 2022-08-15 VITALS — BP 104/64 | HR 72 | Resp 16

## 2022-08-15 DIAGNOSIS — N914 Secondary oligomenorrhea: Secondary | ICD-10-CM | POA: Diagnosis not present

## 2022-08-15 DIAGNOSIS — N83202 Unspecified ovarian cyst, left side: Secondary | ICD-10-CM

## 2022-08-15 DIAGNOSIS — N951 Menopausal and female climacteric states: Secondary | ICD-10-CM | POA: Diagnosis not present

## 2022-08-15 NOTE — Progress Notes (Signed)
    Carla Watts [facility] 1965/03/07 [id number]        57 y.o.  [id number]   RP: Left Ovarian Cyst for Pelvic US  HPI:  H/O Left Ovarian Cyst.  No pelvic pain.  S/P Rt Oophorectomy for Dermoid in 2002. Perimenopause with oligomenorrhea about every 6 months. LMP light on 06/06/22 on the Progestin only pill. No hot flushes or night sweats.   OB History  Gravida Para Term Preterm AB Living  5 3 2 1 2 3   SAB IAB Ectopic Multiple Live Births  2            # Outcome Date GA Lbr Len/2nd Weight Sex Delivery Anes PTL Lv  5 SAB           4 SAB           3 Preterm           2 Term           1 Term             Past medical history,surgical history, problem list, medications, allergies, family history and social history were all reviewed and documented in the EPIC chart.   Directed ROS with pertinent positives and negatives documented in the history of present illness/assessment and plan.  Exam:  Vitals:   08/15/22 0908  BP: 104/64  Pulse: 72  Resp: 16   General appearance:  Normal  Pelvic US today: Comparison is made with previous outside scan in 2022.  T/V images.  Anteverted uterus normal in size and shape measured at 9.32 x 6 x 5.05 cm.  Inhomogeneous myometrium suggestive of adenomyosis with several small intramural fibroids the largest measured at 1.14 cm.  The endometrial lining is thin measured at 2.79 mm with no mass or thickening or abnormal blood flow seen.  The right ovary is surgically absent.  Left ovary normal in size with several small simple follicles.  No cyst seen today.  No adnexal mass.  No free fluid in the pelvis.   Assessment/Plan:  57 y.o. [id number]   1. Cyst of left ovary H/O Left Ovarian Cyst.  No pelvic pain.  S/P Rt Oophorectomy for Dermoid in 2002. Pelvic US findings thoroughly reviewed with patient.  Resolved Left Ovarian Cyst.  Endometrial line thin and normal.  Patient reassured.  2. Perimenopause Perimenopause with oligomenorrhea about every 6 months. LMP  light on 06/06/22 on the Progestin only pill. No hot flushes or night sweats. No CI to continue on the Progestin pill until has more menopausal symptoms. Vasectomy.  Other orders - fluconazole (DIFLUCAN) 150 MG tablet; Take by mouth.   Genia Del MD, 9:21 AM 08/15/2022

## 2022-11-03 ENCOUNTER — Other Ambulatory Visit: Payer: Self-pay | Admitting: Family Medicine

## 2023-01-01 ENCOUNTER — Encounter: Payer: Self-pay | Admitting: Family Medicine

## 2023-01-02 ENCOUNTER — Encounter: Payer: Self-pay | Admitting: Family Medicine

## 2023-01-02 ENCOUNTER — Ambulatory Visit: Payer: PRIVATE HEALTH INSURANCE | Admitting: Family Medicine

## 2023-01-02 ENCOUNTER — Other Ambulatory Visit (HOSPITAL_COMMUNITY)
Admission: RE | Admit: 2023-01-02 | Discharge: 2023-01-02 | Disposition: A | Discharge status: Home / Self Care | Payer: PRIVATE HEALTH INSURANCE | Source: Ambulatory Visit | Attending: Family Medicine | Admitting: Family Medicine

## 2023-01-02 VITALS — BP 100/70 | HR 83 | Temp 98.8°F | Ht 61.75 in | Wt 132.0 lb

## 2023-01-02 DIAGNOSIS — N898 Other specified noninflammatory disorders of vagina: Secondary | ICD-10-CM

## 2023-01-02 DIAGNOSIS — Z113 Encounter for screening for infections with a predominantly sexual mode of transmission: Secondary | ICD-10-CM | POA: Insufficient documentation

## 2023-01-02 MED ORDER — FLUCONAZOLE 150 MG PO TABS: 150.0000 mg | ORAL_TABLET | Freq: Once | ORAL | 0 refills | Status: AC

## 2023-01-02 MED ORDER — METRONIDAZOLE 500 MG PO TABS: 500.0000 mg | ORAL_TABLET | Freq: Two times a day (BID) | ORAL | 0 refills | Status: AC

## 2023-01-02 NOTE — Progress Notes (Signed)
Carla Watts , 03/02/1966, 57 y.o., female MRN: [id number] Patient Care Team    Relationship Specialty Notifications Start End  Natalia Leatherwood, DO PCP - General Family Medicine  11/24/14   Thomasene Ripple, DO PCP - Cardiology Cardiology  06/20/20   Tressia Danas, MD (Inactive) Consulting Physician Gastroenterology  05/19/20   Monica Becton, MD Consulting Physician Sports Medicine  05/19/20     Chief Complaint  Patient presents with   Vaginal Concern    Pt did self test Tues night/Wed morning; denies itching but has slight odor     Subjective: Carla Watts is a 57 y.o. Pt presents for an OV with complaints of 2 day history of vaginal odor.  Associated symptoms include nothing. She reports she has had BV in the past and feels symptoms are similar.  She denies dysuria or vaginal itching. She reports she always has vaginal discharge, but she does not think it has changed.  She has had unprotected sex and would like to have STD panel completed for screening.       01/02/2023    8:40 AM 01/03/2022    8:35 AM 07/04/2020    8:24 AM 05/15/2020    4:01 PM 07/01/2019   11:11 AM  Depression screen PHQ 2/9  Decreased Interest 0 0 0 0 0  Down, Depressed, Hopeless 0 0 0 0 0  PHQ - 2 Score 0 0 0 0 0    Allergies  Allergen Reactions   Meloxicam Rash   Penicillins Rash   Social History   Social History Narrative   Married, 3 children.   - drinks some caffeine.   - uses herbal remedies at time.    - Wears a seatbelt, exercises 3x/w, smoke alarm in the home   - Guns in the home, locked case   - Feels safe in relationship   Past Medical History:  Diagnosis Date   Abnormal Pap smear of cervix    Allergy    mild    Chicken pox    GERD (gastroesophageal reflux disease)    Migraines    during pregnancy   Palpitations    comes and goes    Past Surgical History:  Procedure Laterality Date   CERVICAL BIOPSY  W/ LOOP ELECTRODE EXCISION     CESAREAN  SECTION     laparoscopy of uterus     OOPHORECTOMY Right 2002   dermoid cyst   Family History  Problem Relation Age of Onset   Hypertension Mother    Thyroid disease Mother    Breast cancer Mother 33   Skin cancer Mother    Stroke Father    Skin cancer Father    Thyroid disease Maternal Aunt    Ovarian cancer Paternal Aunt    Brain cancer Paternal Uncle    Arthritis Maternal Grandmother        RA   Thyroid disease Maternal Grandmother    Heart attack Maternal Grandfather    Heart disease Maternal Grandfather 108       Died of MI   Alcohol abuse Paternal Grandfather    Allergies as of 01/02/2023       Reactions   Meloxicam Rash   Penicillins Rash        Medication List        Accurate as of January 02, 2023  8:48 AM. If you have any questions, ask your nurse or doctor.  STOP taking these medications    fluconazole 150 MG tablet Commonly known as: DIFLUCAN Stopped by: Felix Pacini       TAKE these medications    ELDERBERRY PO Take 1 tablet by mouth. Unknown strength   fluticasone 50 MCG/ACT nasal spray Commonly known as: FLONASE SPRAY 2 SPRAYS INTO EACH NOSTRIL EVERY DAY   norethindrone 0.35 MG tablet Commonly known as: MICRONOR Take 1 tablet (0.35 mg total) by mouth daily.   vitamin C 100 MG tablet Take 100 mg by mouth.   Vitamin D-3 25 MCG (1000 UT) Caps Take 1,000 Units by mouth.        All past medical history, surgical history, allergies, family history, immunizations andmedications were updated in the EMR today and reviewed under the history and medication portions of their EMR.     ROS Negative, with the exception of above mentioned in HPI   Objective:  BP 100/70   Pulse 83   Temp 98.8 F (37.1 C) (Oral)   Ht 5' 1.75" (1.568 m)   Wt 132 lb (59.9 kg)   SpO2 99%   BMI 24.34 kg/m  Body mass index is 24.34 kg/m. Physical Exam Vitals and nursing note reviewed.  Constitutional:      General: She is not in acute  distress.    Appearance: Normal appearance. She is normal weight. She is not ill-appearing or toxic-appearing.  HENT:     Head: Normocephalic and atraumatic.  Eyes:     General: No scleral icterus.       Right eye: No discharge.        Left eye: No discharge.     Extraocular Movements: Extraocular movements intact.     Conjunctiva/sclera: Conjunctivae normal.     Pupils: Pupils are equal, round, and reactive to light.  Abdominal:     General: Abdomen is flat. There is no distension.     Palpations: Abdomen is soft.     Tenderness: There is no abdominal tenderness.  Skin:    Findings: No rash.  Neurological:     Mental Status: She is alert and oriented to person, place, and time. Mental status is at baseline.     Motor: No weakness.     Coordination: Coordination normal.     Gait: Gait normal.  Psychiatric:        Mood and Affect: Mood normal.        Behavior: Behavior normal.        Thought Content: Thought content normal.        Judgment: Judgment normal.     No results found. No results found. No results found for this or any previous visit (from the past 24 hour(s)).  Assessment/Plan: Carla Watts is a 57 y.o. female present for OV for  Screen for STD (sexually transmitted disease)/Vaginal odor - HIV antibody (with reflex) - Hepatitis, Acute - RPR - Urine cytology ancillary only([place]) - HSV(herpes simplex vrs) 1+2 ab-IgG - pt desires std screening completed today.  - treating for BV w/ flagyl, with x1 diflucan tab as well in the event needed.   Reviewed expectations re: course of current medical issues. Discussed self-management of symptoms. Outlined signs and symptoms indicating need for more acute intervention. Patient verbalized understanding and all questions were answered. Patient received an After-Visit Summary.    No orders of the defined types were placed in this encounter.  No orders of the defined types were placed in this  encounter.  Referral Orders  No  referral(s) requested today     Note is dictated utilizing voice recognition software. Although note has been proof read prior to signing, occasional typographical errors still can be missed. If any questions arise, please do not hesitate to call for verification.   electronically signed by:  Felix Pacini, DO  [facility] [place]

## 2023-01-02 NOTE — Patient Instructions (Addendum)

## 2023-01-03 LAB — URINE CYTOLOGY ANCILLARY ONLY
Chlamydia: NEGATIVE
Comment: NEGATIVE
Comment: NEGATIVE
Comment: NORMAL
Neisseria Gonorrhea: NEGATIVE
Trichomonas: NEGATIVE

## 2023-01-04 LAB — HSV(HERPES SIMPLEX VRS) I + II AB-IGG
HSV 1 IGG,TYPE SPECIFIC AB: <0.9 {index}
HSV 2 IGG,TYPE SPECIFIC AB: <0.9 {index}

## 2023-01-04 LAB — HEPATITIS PANEL, ACUTE
Hep A IgM: NONREACTIVE
Hep B C IgM: NONREACTIVE
Hepatitis B Surface Ag: NONREACTIVE
Hepatitis C Ab: NONREACTIVE

## 2023-01-04 LAB — HIV ANTIBODY (ROUTINE TESTING W REFLEX): HIV 1&2 Ab, 4th Generation: NONREACTIVE

## 2023-01-04 LAB — RPR: RPR Ser Ql: NONREACTIVE

## 2023-02-03 ENCOUNTER — Ambulatory Visit: Payer: PRIVATE HEALTH INSURANCE | Admitting: Family Medicine

## 2023-02-03 ENCOUNTER — Encounter: Payer: Self-pay | Admitting: Family Medicine

## 2023-02-03 VITALS — BP 118/80 | HR 56 | Temp 98.2°F | Wt 135.0 lb

## 2023-02-03 DIAGNOSIS — M79644 Pain in right finger(s): Secondary | ICD-10-CM | POA: Diagnosis not present

## 2023-02-03 MED ORDER — DICLOFENAC SODIUM 75 MG PO TBEC: 75.0000 mg | DELAYED_RELEASE_TABLET | Freq: Two times a day (BID) | ORAL | 2 refills | Status: DC

## 2023-02-03 NOTE — Patient Instructions (Addendum)

## 2023-02-03 NOTE — Progress Notes (Signed)
Carla Watts , 06-01-1965, 57 y.o., female MRN: [id number] Patient Care Team    Relationship Specialty Notifications Start End  Natalia Leatherwood, DO PCP - General Family Medicine  11/24/14   Thomasene Ripple, DO PCP - Cardiology Cardiology  06/20/20   Tressia Danas, MD (Inactive) Consulting Physician Gastroenterology  05/19/20   Monica Becton, MD Consulting Physician Sports Medicine  05/19/20     Chief Complaint  Patient presents with   Hand Pain    Right thumb pain going on for several weeks    Subjective: Carla Watts is a 57 y.o. Pt presents for an OV with complaints of right thumb pain of 2 weeks duration. Points to DIP right thumb.  Associated symptoms include mild swelling, mild redness.  No known injury.  Discomfort to push to help on the tip of her thumb Pt has tried Advil to ease their symptoms.       01/02/2023    8:40 AM 01/03/2022    8:35 AM 07/04/2020    8:24 AM 05/15/2020    4:01 PM 07/01/2019   11:11 AM  Depression screen PHQ 2/9  Decreased Interest 0 0 0 0 0  Down, Depressed, Hopeless 0 0 0 0 0  PHQ - 2 Score 0 0 0 0 0    Allergies  Allergen Reactions   Meloxicam Rash   Penicillins Rash   Social History   Social History Narrative   Married, 3 children.   - drinks some caffeine.   - uses herbal remedies at time.    - Wears a seatbelt, exercises 3x/w, smoke alarm in the home   - Guns in the home, locked case   - Feels safe in relationship   Past Medical History:  Diagnosis Date   Abnormal Pap smear of cervix    Allergy    mild    Chicken pox    GERD (gastroesophageal reflux disease)    Migraines    during pregnancy   Palpitations    comes and goes    Past Surgical History:  Procedure Laterality Date   CERVICAL BIOPSY  W/ LOOP ELECTRODE EXCISION     CESAREAN SECTION     laparoscopy of uterus     OOPHORECTOMY Right 2002   dermoid cyst   Family History  Problem Relation Age of Onset   Hypertension Mother     Thyroid disease Mother    Breast cancer Mother 28   Skin cancer Mother    Stroke Father    Skin cancer Father    Thyroid disease Maternal Aunt    Ovarian cancer Paternal Aunt    Brain cancer Paternal Uncle    Arthritis Maternal Grandmother        RA   Thyroid disease Maternal Grandmother    Heart attack Maternal Grandfather    Heart disease Maternal Grandfather 18       Died of MI   Alcohol abuse Paternal Grandfather    Allergies as of 02/03/2023       Reactions   Meloxicam Rash   Penicillins Rash        Medication List        Accurate as of February 03, 2023  3:16 PM. If you have any questions, ask your nurse or doctor.          diclofenac 75 MG EC tablet Commonly known as: VOLTAREN Take 1 tablet (75 mg total) by mouth 2 (two) times daily. Started by: Luster Landsberg  Carla Watts   ELDERBERRY PO Take 1 tablet by mouth. Unknown strength   fluticasone 50 MCG/ACT nasal spray Commonly known as: FLONASE SPRAY 2 SPRAYS INTO EACH NOSTRIL EVERY DAY   norethindrone 0.35 MG tablet Commonly known as: MICRONOR Take 1 tablet (0.35 mg total) by mouth daily.   vitamin C 100 MG tablet Take 100 mg by mouth.   Vitamin D-3 25 MCG (1000 UT) Caps Take 1,000 Units by mouth.        All past medical history, surgical history, allergies, family history, immunizations andmedications were updated in the EMR today and reviewed under the history and medication portions of their EMR.     ROS Negative, with the exception of above mentioned in HPI   Objective:  BP 118/80   Pulse (!) 56   Temp 98.2 F (36.8 C)   Wt 135 lb (61.2 kg)   LMP  (Approximate) Comment: June 2024  SpO2 99%   BMI 24.89 kg/m  Body mass index is 24.89 kg/m. Physical Exam Vitals and nursing note reviewed.  Constitutional:      General: She is not in acute distress.    Appearance: Normal appearance. She is normal weight. She is not ill-appearing or toxic-appearing.  HENT:     Head: Normocephalic and atraumatic.   Eyes:     General: No scleral icterus.       Right eye: No discharge.        Left eye: No discharge.     Extraocular Movements: Extraocular movements intact.     Conjunctiva/sclera: Conjunctivae normal.     Pupils: Pupils are equal, round, and reactive to light.  Musculoskeletal:     Comments: Right thumb: There is mild swelling, mild erythema medial aspect of DIP.  Full range of motion of thumb and DIP with discomfort and full extension with pressure on DIP.  Negative Finkelstein.  Multiple DIP bilateral hands with bony nodules.  Neurovascularly intact distally.   Skin:    Findings: No rash.  Neurological:     Mental Status: She is alert and oriented to person, place, and time. Mental status is at baseline.     Motor: No weakness.     Coordination: Coordination normal.     Gait: Gait normal.  Psychiatric:        Mood and Affect: Mood normal.        Behavior: Behavior normal.        Thought Content: Thought content normal.        Judgment: Judgment normal.      No results found. No results found. No results found for this or any previous visit (from the past 24 hour(s)).  Assessment/Plan: Carla Watts is a 57 y.o. female present for OV for  Right thumb pain-initial encounter: -Start Voltaren 75 mg twice daily with food, for at least 2-4 weeks.  Pain can as needed for flares -Start OTC Voltaren gel or Aspercreme to affected area. -X-ray of right thumb ordered [facility] to rule out any fracture/injury as cause.-[facility] [address] [place], [place] [address] -We discussed possible referral to orthopedics if appropriate after x-ray results received.  Reviewed expectations re: course of current medical issues. Discussed self-management of symptoms. Outlined signs and symptoms indicating need for more acute intervention. Patient verbalized understanding and all questions  were answered. Patient received an After-Visit Summary.    Orders Placed This Encounter  Procedures  DG Finger Thumb Right   Meds ordered this encounter  Medications   diclofenac (VOLTAREN) 75 MG EC tablet    Sig: Take 1 tablet (75 mg total) by mouth 2 (two) times daily.    Dispense:  60 tablet    Refill:  2   Referral Orders  No referral(s) requested today     Note is dictated utilizing voice recognition software. Although note has been proof read prior to signing, occasional typographical errors still can be missed. If any questions arise, please do not hesitate to call for verification.   electronically signed by:  Felix Pacini, DO  [facility] - [place]

## 2023-02-04 ENCOUNTER — Telehealth: Payer: Self-pay | Admitting: Family Medicine

## 2023-02-04 NOTE — Telephone Encounter (Signed)
Order faxed and pt notified.

## 2023-02-04 NOTE — Telephone Encounter (Signed)
Patient calls to report that the imaging location listed will not conduct the imaging on her thumb until an order is placed by Dr. Claiborne Billings. Please give the patient a call once the order is placed.  -[facility] [address] [place], [place] [address]

## 2023-02-14 ENCOUNTER — Telehealth: Payer: Self-pay | Admitting: Family Medicine

## 2023-02-14 NOTE — Telephone Encounter (Signed)
Please call patient We received the fax of her x-ray of her thumb result. There is no fracture or dislocation.  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  There is some spurring noted and some mineralization suggesting she had a injury at some point in the past. If symptoms do not improve or worsen and she would like orthopedic intervention.  Next step would be to refer to orthopedics.  No interventions required since this is arthritic in nature.  Anti-inflammatories should help with discomfort.

## 2023-02-14 NOTE — Telephone Encounter (Signed)
The options are nsaids for flares, including the topical versions, such as OTC Voltaren creams or orthopedic referral. The orthopedic would be able to tell her if they feel she is a candidate for steroid shot vs surgical intervention.

## 2023-02-24 ENCOUNTER — Other Ambulatory Visit: Payer: Self-pay | Admitting: Family Medicine

## 2023-04-28 DIAGNOSIS — F439 Reaction to severe stress, unspecified: Secondary | ICD-10-CM | POA: Diagnosis not present

## 2023-05-09 DIAGNOSIS — F439 Reaction to severe stress, unspecified: Secondary | ICD-10-CM | POA: Diagnosis not present

## 2023-05-23 DIAGNOSIS — F439 Reaction to severe stress, unspecified: Secondary | ICD-10-CM | POA: Diagnosis not present

## 2023-06-09 ENCOUNTER — Other Ambulatory Visit (HOSPITAL_BASED_OUTPATIENT_CLINIC_OR_DEPARTMENT_OTHER): Payer: Self-pay | Admitting: Family Medicine

## 2023-06-09 DIAGNOSIS — Z1231 Encounter for screening mammogram for malignant neoplasm of breast: Secondary | ICD-10-CM

## 2023-06-10 ENCOUNTER — Other Ambulatory Visit: Payer: Self-pay

## 2023-06-10 DIAGNOSIS — F439 Reaction to severe stress, unspecified: Secondary | ICD-10-CM | POA: Diagnosis not present

## 2023-06-10 MED ORDER — NORETHINDRONE 0.35 MG PO TABS: 1.0000 | ORAL_TABLET | Freq: Every day | ORAL | 0 refills | Status: DC

## 2023-06-10 NOTE — Telephone Encounter (Signed)
 Pt LVM in triage line stating she needed refills on med.  Med refill request: POPs Last AEX: 05/29/2022-ML Next AEX: 08/13/2023-JC Last MMG (if hormonal med): 01/18/2022, scheduled for 06/26/2023 Refill authorized: rx pend.   Pharmacy confirmed with pt.

## 2023-06-20 DIAGNOSIS — F439 Reaction to severe stress, unspecified: Secondary | ICD-10-CM | POA: Diagnosis not present

## 2023-06-23 DIAGNOSIS — M9902 Segmental and somatic dysfunction of thoracic region: Secondary | ICD-10-CM | POA: Diagnosis not present

## 2023-06-23 DIAGNOSIS — M531 Cervicobrachial syndrome: Secondary | ICD-10-CM | POA: Diagnosis not present

## 2023-06-23 DIAGNOSIS — M9901 Segmental and somatic dysfunction of cervical region: Secondary | ICD-10-CM | POA: Diagnosis not present

## 2023-06-25 ENCOUNTER — Other Ambulatory Visit: Payer: Self-pay | Admitting: Family Medicine

## 2023-06-26 ENCOUNTER — Ambulatory Visit: Payer: PRIVATE HEALTH INSURANCE

## 2023-06-26 DIAGNOSIS — Z1231 Encounter for screening mammogram for malignant neoplasm of breast: Secondary | ICD-10-CM

## 2023-06-30 ENCOUNTER — Encounter: Payer: Self-pay | Admitting: Family Medicine

## 2023-07-02 DIAGNOSIS — F439 Reaction to severe stress, unspecified: Secondary | ICD-10-CM | POA: Diagnosis not present

## 2023-07-23 DIAGNOSIS — M9902 Segmental and somatic dysfunction of thoracic region: Secondary | ICD-10-CM | POA: Diagnosis not present

## 2023-07-23 DIAGNOSIS — M531 Cervicobrachial syndrome: Secondary | ICD-10-CM | POA: Diagnosis not present

## 2023-07-23 DIAGNOSIS — M9901 Segmental and somatic dysfunction of cervical region: Secondary | ICD-10-CM | POA: Diagnosis not present

## 2023-07-24 ENCOUNTER — Encounter: Admitting: Family Medicine

## 2023-07-25 ENCOUNTER — Telehealth: Payer: Self-pay | Admitting: Family Medicine

## 2023-07-25 MED ORDER — FLUTICASONE PROPIONATE 50 MCG/ACT NA SUSP: 2.0000 | Freq: Every day | NASAL | 1 refills | Status: DC

## 2023-07-25 NOTE — Telephone Encounter (Signed)
 Copied from CRM [id number]. Topic: Clinical - Medication Refill >> Jul 25, 2023 10:40 AM Adonis Hoot wrote: Medication: fluticasone  (FLONASE ) 50 MCG/ACT nasal spray  Has the patient contacted their pharmacy? No (Agent: If no, request that the patient contact the pharmacy for the refill. If patient does not wish to contact the pharmacy document the reason why and proceed with request.) (Agent: If yes, when and what did the pharmacy advise?)  This is the patient's preferred pharmacy:    [id number] - [place], [place] - [address] [place] [address] Phone: [phone] Fax: [phone]  Is this the correct pharmacy for this prescription? Yes If no, delete pharmacy and type the correct one.   Has the prescription been filled recently? No  Is the patient out of the medication? Yes  Has the patient been seen for an appointment in the last year OR does the patient have an upcoming appointment? Yes  Can we respond through MyChart? Yes  Agent: Please be advised that Rx refills may take up to 3 business days. We ask that you follow-up with your pharmacy.

## 2023-07-30 DIAGNOSIS — F439 Reaction to severe stress, unspecified: Secondary | ICD-10-CM | POA: Diagnosis not present

## 2023-08-11 DIAGNOSIS — F439 Reaction to severe stress, unspecified: Secondary | ICD-10-CM | POA: Diagnosis not present

## 2023-08-13 ENCOUNTER — Ambulatory Visit (INDEPENDENT_AMBULATORY_CARE_PROVIDER_SITE_OTHER): Admitting: Radiology

## 2023-08-13 ENCOUNTER — Encounter: Payer: Self-pay | Admitting: Radiology

## 2023-08-13 VITALS — BP 102/60 | Ht 61.5 in | Wt 126.0 lb

## 2023-08-13 DIAGNOSIS — N951 Menopausal and female climacteric states: Secondary | ICD-10-CM | POA: Diagnosis not present

## 2023-08-13 DIAGNOSIS — Z1331 Encounter for screening for depression: Secondary | ICD-10-CM

## 2023-08-13 DIAGNOSIS — Z01419 Encounter for gynecological examination (general) (routine) without abnormal findings: Secondary | ICD-10-CM

## 2023-08-13 DIAGNOSIS — Z113 Encounter for screening for infections with a predominantly sexual mode of transmission: Secondary | ICD-10-CM

## 2023-08-13 DIAGNOSIS — Z30018 Encounter for initial prescription of other contraceptives: Secondary | ICD-10-CM

## 2023-08-13 MED ORDER — ESTRADIOL 0.025 MG/24HR TD PTTW: 1.0000 | MEDICATED_PATCH | TRANSDERMAL | 4 refills | Status: DC

## 2023-08-13 MED ORDER — PROGESTERONE MICRONIZED 100 MG PO CAPS: 100.0000 mg | ORAL_CAPSULE | Freq: Every day | ORAL | 4 refills | Status: AC

## 2023-08-13 MED ORDER — PHEXXI 1.8-1-0.4 % VA GEL: 5.0000 g | VAGINAL | 11 refills | Status: AC

## 2023-08-13 NOTE — Patient Instructions (Signed)
 Preventive Care 16-57 Years Old, Female  Preventive care refers to lifestyle choices and visits with your health care provider that can promote health and wellness. Preventive care visits are also called wellness exams.  What can I expect for my preventive care visit?  Counseling  Your health care provider may ask you questions about your:  Medical history, including:  Past medical problems.  Family medical history.  Pregnancy history.  Current health, including:  Menstrual cycle.  Method of birth control.  Emotional well-being.  Home life and relationship well-being.  Sexual activity and sexual health.  Lifestyle, including:  Alcohol, nicotine or tobacco, and drug use.  Access to firearms.  Diet, exercise, and sleep habits.  Work and work Astronomer.  Sunscreen use.  Safety issues such as seatbelt and bike helmet use.  Physical exam  Your health care provider will check your:  Height and weight. These may be used to calculate your BMI (body mass index). BMI is a measurement that tells if you are at a healthy weight.  Waist circumference. This measures the distance around your waistline. This measurement also tells if you are at a healthy weight and may help predict your risk of certain diseases, such as type 2 diabetes and high blood pressure.  Heart rate and blood pressure.  Body temperature.  Skin for abnormal spots.  What immunizations do I need?    Vaccines are usually given at various ages, according to a schedule. Your health care provider will recommend vaccines for you based on your age, medical history, and lifestyle or other factors, such as travel or where you work.  What tests do I need?  Screening  Your health care provider may recommend screening tests for certain conditions. This may include:  Lipid and cholesterol levels.  Diabetes screening. This is done by checking your blood sugar (glucose) after you have not eaten for a while (fasting).  Pelvic exam and Pap test.  Hepatitis B test.  Hepatitis C  test.  HIV (human immunodeficiency virus) test.  STI (sexually transmitted infection) testing, if you are at risk.  Lung cancer screening.  Colorectal cancer screening.  Mammogram. Talk with your health care provider about when you should start having regular mammograms. This may depend on whether you have a family history of breast cancer.  BRCA-related cancer screening. This may be done if you have a family history of breast, ovarian, tubal, or peritoneal cancers.  Bone density scan. This is done to screen for osteoporosis.  Talk with your health care provider about your test results, treatment options, and if necessary, the need for more tests.  Follow these instructions at home:  Eating and drinking    Eat a diet that includes fresh fruits and vegetables, whole grains, lean protein, and low-fat dairy products.  Take vitamin and mineral supplements as recommended by your health care provider.  Do not drink alcohol if:  Your health care provider tells you not to drink.  You are pregnant, may be pregnant, or are planning to become pregnant.  If you drink alcohol:  Limit how much you have to 0-1 drink a day.  Know how much alcohol is in your drink. In the [place] one drink equals one 12 oz bottle of beer (355 mL), one 5 oz glass of wine (148 mL), or one 1 oz glass of hard liquor (44 mL).  Lifestyle  Brush your teeth every morning and night with fluoride toothpaste. Floss one time each day.  Exercise for at least  30 minutes 5 or more days each week.  Do not use any products that contain nicotine or tobacco. These products include cigarettes, chewing tobacco, and vaping devices, such as e-cigarettes. If you need help quitting, ask your health care provider.  Do not use drugs.  If you are sexually active, practice safe sex. Use a condom or other form of protection to prevent STIs.  If you do not wish to become pregnant, use a form of birth control. If you plan to become pregnant, see your health care provider for a  prepregnancy visit.  Take aspirin only as told by your health care provider. Make sure that you understand how much to take and what form to take. Work with your health care provider to find out whether it is safe and beneficial for you to take aspirin daily.  Find healthy ways to manage stress, such as:  Meditation, yoga, or listening to music.  Journaling.  Talking to a trusted person.  Spending time with friends and family.  Minimize exposure to UV radiation to reduce your risk of skin cancer.  Safety  Always wear your seat belt while driving or riding in a vehicle.  Do not drive:  If you have been drinking alcohol. Do not ride with someone who has been drinking.  When you are tired or distracted.  While texting.  If you have been using any mind-altering substances or drugs.  Wear a helmet and other protective equipment during sports activities.  If you have firearms in your house, make sure you follow all gun safety procedures.  Seek help if you have been physically or sexually abused.  What's next?  Visit your health care provider once a year for an annual wellness visit.  Ask your health care provider how often you should have your eyes and teeth checked.  Stay up to date on all vaccines.  This information is not intended to replace advice given to you by your health care provider. Make sure you discuss any questions you have with your health care provider.  Document Revised: 08/16/2020 Document Reviewed: 08/16/2020  Elsevier Patient Education  2024 [employer].

## 2023-08-13 NOTE — Progress Notes (Signed)
 Carla Watts [facility] 1965-08-09 [id number]   History:  58 y.o. G5P3 presents for annual exam.Has a new sexual partner, currently separated from her husband. Currently on POPs, periods are very irregular. Having some night sweats and joint pain. Interested in trying HRT. Exercises regularly, does pole dance for exercise. Right oophorectomy 2002 for dermoid.  Gynecologic History Patient's last menstrual period was 06/22/2023 (exact date). Period Cycle (Days):  (skipping periods) Period Duration (Days): 5 Period Pattern: (!) Irregular Menstrual Flow: Moderate Menstrual Control:  (menstrual cup) Dysmenorrhea: (!) Mild Dysmenorrhea Symptoms: Cramping Contraception/Family planning: oral progesterone-only contraceptive Sexually active: yes Last Pap: 2024. Results were: normal Last mammogram: 4/25. Results were: normal  Obstetric History OB History  Gravida Para Term Preterm AB Living  5 3 2 1 2 3   SAB IAB Ectopic Multiple Live Births  2        # Outcome Date GA Lbr Len/2nd Weight Sex Type Anes PTL Lv  5 SAB           4 SAB           3 Preterm           2 Term           1 Term                08/13/2023    8:12 AM 01/02/2023    8:40 AM 01/03/2022    8:35 AM  Depression screen PHQ 2/9  Decreased Interest 0 0 0  Down, Depressed, Hopeless 0 0 0  PHQ - 2 Score 0 0 0     The following portions of the patient's history were reviewed and updated as appropriate: allergies, current medications, past family history, past medical history, past social history, past surgical history, and problem list.  Review of Systems  All other systems reviewed and are negative.   Past medical history, past surgical history, family history and social history were all reviewed and documented in the EPIC chart.  Exam:  Vitals:   08/13/23 0811  BP: 102/60  Weight: 126 lb (57.2 kg)  Height: 5' 1.5 (1.562 m)   Body mass index is 23.42 kg/m.  Physical Exam Vitals and nursing note reviewed.  Exam conducted with a chaperone present.  Constitutional:      Appearance: Normal appearance. She is normal weight.  HENT:     Head: Normocephalic and atraumatic.  Neck:     Thyroid : No thyroid  mass, thyromegaly or thyroid  tenderness.  Cardiovascular:     Rate and Rhythm: Regular rhythm.     Heart sounds: Normal heart sounds.  Pulmonary:     Effort: Pulmonary effort is normal.     Breath sounds: Normal breath sounds.  Chest:  Breasts:    Breasts are symmetrical.     Right: Normal. No inverted nipple, mass, nipple discharge, skin change or tenderness.     Left: Normal. No inverted nipple, mass, nipple discharge, skin change or tenderness.  Abdominal:     General: Abdomen is flat. Bowel sounds are normal.     Palpations: Abdomen is soft.  Genitourinary:    General: Normal vulva.     Vagina: Normal. No vaginal discharge, bleeding or lesions.     Cervix: Normal. No discharge or lesion.     Uterus: Normal. Not enlarged and not tender.      Adnexa: Right adnexa normal and left adnexa normal.       Right: No mass (ovary surgically absent, no adnexal mass), tenderness or  fullness.         Left: No mass, tenderness or fullness.    Lymphadenopathy:     Upper Body:     Right upper body: No axillary adenopathy.     Left upper body: No axillary adenopathy.  Skin:    General: Skin is warm and dry.  Neurological:     Mental Status: She is alert and oriented to person, place, and time.  Psychiatric:        Mood and Affect: Mood normal.        Thought Content: Thought content normal.        Judgment: Judgment normal.      Chaperone declined for exam  Assessment/Plan:   1. Well woman exam with routine gynecological exam (Primary) Pap 2027  2. Perimenopausal symptoms Risks and benefits reviewed would like to try HRT for management of vasomotor symptoms and cardioprotection - estradiol (DOTTI) 0.025 MG/24HR; Place 1 patch onto the skin 2 (two) times a week.  Dispense: 24 patch;  Refill: 4 - progesterone (PROMETRIUM) 100 MG capsule; Take 1 capsule (100 mg total) by mouth daily.  Dispense: 90 capsule; Refill: 4  3. Encounter for initial prescription of other contraceptives Use as a back up method - Lactic Ac-Citric Ac-Pot Bitart (PHEXXI) 1.8-1-0.4 % GEL; Place 5 g vaginally as directed. Before intercourse  Dispense: 120 g; Refill: 11  4. Screening for STDs (sexually transmitted diseases) - SURESWAB CT/NG/T. vaginalis  - STD serology with PCP with her annual labs per pt  5. Depression screening negative     Return in about 1 year (around 08/12/2024) for Annual.  Synetta Eves B WHNP-BC 8:51 AM 08/13/2023

## 2023-08-14 LAB — SURESWAB CT/NG/T. VAGINALIS
C. trachomatis RNA, TMA: NOT DETECTED
N. gonorrhoeae RNA, TMA: NOT DETECTED
Trichomonas vaginalis RNA: NOT DETECTED

## 2023-08-15 ENCOUNTER — Ambulatory Visit: Payer: Self-pay | Admitting: Radiology

## 2023-08-16 ENCOUNTER — Other Ambulatory Visit: Payer: Self-pay | Admitting: Family Medicine

## 2023-08-20 DIAGNOSIS — M9902 Segmental and somatic dysfunction of thoracic region: Secondary | ICD-10-CM | POA: Diagnosis not present

## 2023-08-20 DIAGNOSIS — M531 Cervicobrachial syndrome: Secondary | ICD-10-CM | POA: Diagnosis not present

## 2023-08-20 DIAGNOSIS — M9901 Segmental and somatic dysfunction of cervical region: Secondary | ICD-10-CM | POA: Diagnosis not present

## 2023-09-09 ENCOUNTER — Encounter: Admitting: Family Medicine

## 2023-09-17 DIAGNOSIS — M9901 Segmental and somatic dysfunction of cervical region: Secondary | ICD-10-CM | POA: Diagnosis not present

## 2023-09-17 DIAGNOSIS — M531 Cervicobrachial syndrome: Secondary | ICD-10-CM | POA: Diagnosis not present

## 2023-09-17 DIAGNOSIS — M9902 Segmental and somatic dysfunction of thoracic region: Secondary | ICD-10-CM | POA: Diagnosis not present

## 2023-09-17 DIAGNOSIS — F439 Reaction to severe stress, unspecified: Secondary | ICD-10-CM | POA: Diagnosis not present

## 2023-09-23 ENCOUNTER — Encounter: Payer: Self-pay | Admitting: Family Medicine

## 2023-09-23 ENCOUNTER — Ambulatory Visit (INDEPENDENT_AMBULATORY_CARE_PROVIDER_SITE_OTHER): Admitting: Family Medicine

## 2023-09-23 VITALS — BP 100/60 | HR 69 | Temp 98.1°F | Ht 63.39 in | Wt 128.4 lb

## 2023-09-23 DIAGNOSIS — Z13220 Encounter for screening for lipoid disorders: Secondary | ICD-10-CM

## 2023-09-23 DIAGNOSIS — Z793 Long term (current) use of hormonal contraceptives: Secondary | ICD-10-CM | POA: Diagnosis not present

## 2023-09-23 DIAGNOSIS — Z131 Encounter for screening for diabetes mellitus: Secondary | ICD-10-CM | POA: Diagnosis not present

## 2023-09-23 DIAGNOSIS — Z0000 Encounter for general adult medical examination without abnormal findings: Secondary | ICD-10-CM

## 2023-09-23 DIAGNOSIS — Z1231 Encounter for screening mammogram for malignant neoplasm of breast: Secondary | ICD-10-CM

## 2023-09-23 LAB — COMPREHENSIVE METABOLIC PANEL WITH GFR
ALT: 13 U/L (ref 0–35)
AST: 18 U/L (ref 0–37)
Albumin: 4.6 g/dL (ref 3.5–5.2)
Alkaline Phosphatase: 49 U/L (ref 39–117)
BUN: 13 mg/dL (ref 6–23)
CO2: 28 meq/L (ref 19–32)
Calcium: 9.3 mg/dL (ref 8.4–10.5)
Chloride: 104 meq/L (ref 96–112)
Creatinine, Ser: 0.83 mg/dL (ref 0.40–1.20)
GFR: 78.55 mL/min (ref >60.00)
Glucose, Bld: 93 mg/dL (ref 70–99)
Potassium: 4.5 meq/L (ref 3.5–5.1)
Sodium: 139 meq/L (ref 135–145)
Total Bilirubin: 0.5 mg/dL (ref 0.2–1.2)
Total Protein: 6.6 g/dL (ref 6.0–8.3)

## 2023-09-23 LAB — LIPID PANEL
Cholesterol: 212 mg/dL — ABNORMAL HIGH (ref 0–200)
HDL: 101 mg/dL (ref >39.00)
LDL Cholesterol: 100 mg/dL — ABNORMAL HIGH (ref 0–99)
NonHDL: 111.49
Total CHOL/HDL Ratio: 2
Triglycerides: 57 mg/dL (ref 0.0–149.0)
VLDL: 11.4 mg/dL (ref 0.0–40.0)

## 2023-09-23 LAB — CBC
HCT: 40.7 % (ref 36.0–46.0)
Hemoglobin: 13.5 g/dL (ref 12.0–15.0)
MCHC: 33.2 g/dL (ref 30.0–36.0)
MCV: 90.1 fl (ref 78.0–100.0)
Platelets: 278 K/uL (ref 150.0–400.0)
RBC: 4.51 Mil/uL (ref 3.87–5.11)
RDW: 13.5 % (ref 11.5–15.5)
WBC: 6.6 K/uL (ref 4.0–10.5)

## 2023-09-23 LAB — TSH: TSH: 1.54 u[IU]/mL (ref 0.35–5.50)

## 2023-09-23 LAB — HEMOGLOBIN A1C: Hgb A1c MFr Bld: 5.9 % (ref 4.6–6.5)

## 2023-09-23 NOTE — Patient Instructions (Addendum)

## 2023-09-23 NOTE — Progress Notes (Signed)
 Follow-up   Patient ID: Carla Watts, female  DOB: Apr 05, 1965, 58 y.o.   MRN: [id number] Patient Care Team    Relationship Specialty Notifications Start End  Catherine Charlies LABOR, DO PCP - General Family Medicine  11/24/14   Curtis Debby PARAS, MD Consulting Physician Sports Medicine  05/19/20   Tobb, Kardie, DO Consulting Physician Cardiology  09/23/23   Lavoie, Marie-Lyne, MD  Obstetrics and Gynecology  09/23/23   [facility]    09/23/23     Chief Complaint  Patient presents with   Annual Exam    Pt is fasting    Subjective:  Carla Watts is a 58 y.o.  Female  present for CPE .All past medical history, surgical history, allergies, family history, immunizations, medications and social history were updated in the electronic medical record today. All recent labs, ED visits and hospitalizations within the last year were reviewed.  Health maintenance:  Colonoscopy: 08/16/2019. [facility]- 10 yr follow up. Mammogram: completed 06/26/2023- [facility] history of breast cancer[facility] Cervical cancer screening: Last Pap smear 05/29/2022 by gynecology.  Family history ovarian cancer. Dr. Lavoie Immunizations: tdap 01/2016 UTD, flu shot UTD -encouraged yearly, shingrix  # 1-she did not complete series, and doe snot desire to do so.  Infectious disease screening: HIV completed agreeable to hep c DEXA:  routine screen to start 60-65 Assistive device: none Oxygen [id number] Patient has a Dental home. Hospitalizations/ED visits: reviewed       09/23/2023    8:33 AM 08/13/2023    8:12 AM 01/02/2023    8:40 AM 01/03/2022    8:35 AM 07/04/2020    8:24 AM  Depression screen PHQ 2/9  Decreased Interest 0 0 0 0 0  Down, Depressed, Hopeless 0 0 0 0 0  PHQ - 2 Score 0 0 0 0 0      01/02/2023    8:40 AM 02/27/2018    8:41 AM  GAD 7 : Generalized Anxiety Score  Nervous, Anxious, on Edge 0 1  Control/stop worrying 0 1  Worry too much - different things 0 1  Trouble relaxing 0 0   Restless 0 0  Easily annoyed or irritable 0 1  Afraid - awful might happen 0 1  Total GAD 7 Score 0 5  Anxiety Difficulty Not difficult at all Somewhat difficult    Immunization History  Administered Date(s) Administered   Influenza,inj,Quad PF,6+ Mos 12/18/2016   Influenza-Unspecified 01/04/2016, 12/02/2017, 01/01/2020, 12/06/2021, 12/03/2022   MMR 01/08/2016   PFIZER Comirnaty(Gray Top)Covid-19 Tri-Sucrose Vaccine 07/30/2020   PFIZER(Purple Top)SARS-COV-2 Vaccination 03/10/2019, 03/31/2019, 02/29/2020   Tdap 11/24/2014, 01/04/2016   Zoster Recombinant(Shingrix ) 02/27/2018   Past Medical History:  Diagnosis Date   Abnormal Pap smear of cervix    Allergy    mild    Chicken pox    GERD (gastroesophageal reflux disease)    Migraines    during pregnancy   Palpitations    comes and goes    Allergies  Allergen Reactions   Meloxicam  Rash   Penicillins Rash   Past Surgical History:  Procedure Laterality Date   CERVICAL BIOPSY  W/ LOOP ELECTRODE EXCISION     CESAREAN SECTION     laparoscopy of uterus     OOPHORECTOMY Right 2002   dermoid cyst   Family History  Problem Relation Age of Onset   Hypertension Mother    Thyroid  disease Mother    Breast cancer Mother 74   Skin cancer Mother    Stroke Father  Skin cancer Father    Thyroid  disease Maternal Aunt    Ovarian cancer Paternal Aunt    Brain cancer Paternal Uncle    Arthritis Maternal Grandmother        RA   Thyroid  disease Maternal Grandmother    Heart attack Maternal Grandfather    Heart disease Maternal Grandfather 20       Died of MI   Alcohol abuse Paternal Grandfather    Social History   Social History Narrative   Married, 3 children.   - drinks some caffeine.   - uses herbal remedies at time.    - Wears a seatbelt, exercises 3x/w, smoke alarm in the home   - Guns in the home, locked case   - Feels safe in relationship    Allergies as of 09/23/2023       Reactions   Meloxicam  Rash    Penicillins Rash        Medication List        Accurate as of September 23, 2023  8:50 AM. If you have any questions, ask your nurse or doctor.          STOP taking these medications    fluticasone  50 MCG/ACT nasal spray Commonly known as: FLONASE  Stopped by: Charlies Bellini   vitamin C 100 MG tablet Stopped by: Charlies Bellini   Vitamin D -3 25 MCG (1000 UT) Caps Stopped by: Charlies Bellini       TAKE these medications    diclofenac  75 MG EC tablet Commonly known as: VOLTAREN  Take 1 tablet (75 mg total) by mouth 2 (two) times daily. What changed:  when to take this reasons to take this   ELDERBERRY PO Take 1 tablet by mouth. Unknown strength   estradiol  0.025 MG/24HR Commonly known as: Dotti  Place 1 patch onto the skin 2 (two) times a week.   norethindrone  0.35 MG tablet Commonly known as: MICRONOR  Take 1 tablet (0.35 mg total) by mouth daily.   Phexxi  1.8-1-0.4 % Gel Generic drug: Lactic Ac-Citric Ac-Pot Bitart Place 5 g vaginally as directed. Before intercourse   progesterone  100 MG capsule Commonly known as: PROMETRIUM  Take 1 capsule (100 mg total) by mouth daily.         ROS: 14 pt review of systems performed and negative (unless mentioned in an HPI)  Objective: BP 100/60   Pulse 69   Temp 98.1 F (36.7 C)   Ht 5' 3.39 (1.61 m)   Wt 128 lb 6.4 oz (58.2 kg)   SpO2 98%   BMI 22.47 kg/m  Physical Exam Vitals and nursing note reviewed.  Constitutional:      General: She is not in acute distress.    Appearance: Normal appearance. She is not ill-appearing or toxic-appearing.  HENT:     Head: Normocephalic and atraumatic.     Right Ear: Tympanic membrane, ear canal and external ear normal. There is no impacted cerumen.     Left Ear: Tympanic membrane, ear canal and external ear normal. There is no impacted cerumen.     Nose: No congestion or rhinorrhea.     Mouth/Throat:     Mouth: Mucous membranes are moist.     Pharynx: Oropharynx is clear. No  oropharyngeal exudate or posterior oropharyngeal erythema.  Eyes:     General: No scleral icterus.       Right eye: No discharge.        Left eye: No discharge.     Extraocular Movements: Extraocular movements intact.  Conjunctiva/sclera: Conjunctivae normal.     Pupils: Pupils are equal, round, and reactive to light.  Cardiovascular:     Rate and Rhythm: Normal rate and regular rhythm.     Pulses: Normal pulses.     Heart sounds: Normal heart sounds. No murmur heard.    No friction rub. No gallop.  Pulmonary:     Effort: Pulmonary effort is normal. No respiratory distress.     Breath sounds: Normal breath sounds. No stridor. No wheezing, rhonchi or rales.  Chest:     Chest wall: No tenderness.  Abdominal:     General: Abdomen is flat. Bowel sounds are normal. There is no distension.     Palpations: Abdomen is soft. There is no mass.     Tenderness: There is no abdominal tenderness. There is no right CVA tenderness, left CVA tenderness, guarding or rebound.     Hernia: No hernia is present.  Musculoskeletal:        General: No swelling, tenderness or deformity. Normal range of motion.     Cervical back: Normal range of motion and neck supple. No rigidity or tenderness.     Right lower leg: No edema.     Left lower leg: No edema.  Lymphadenopathy:     Cervical: No cervical adenopathy.  Skin:    General: Skin is warm and dry.     Coloration: Skin is not jaundiced or pale.     Findings: No bruising, erythema, lesion or rash.  Neurological:     General: No focal deficit present.     Mental Status: She is alert and oriented to person, place, and time. Mental status is at baseline.     Cranial Nerves: No cranial nerve deficit.     Sensory: No sensory deficit.     Motor: No weakness.     Coordination: Coordination normal.     Gait: Gait normal.     Deep Tendon Reflexes: Reflexes normal.  Psychiatric:        Mood and Affect: Mood normal.        Behavior: Behavior normal.         Thought Content: Thought content normal.        Judgment: Judgment normal.      No results found.  Assessment/plan: Clarann Helvey is a 58 y.o. female present for CPE Routine general medical examination at a health care facility (Primary) - CBC - Comprehensive metabolic panel with GFR - TSH Patient was encouraged to exercise greater than 150 minutes a week. Patient was encouraged to choose a diet filled with fresh fruits and vegetables, and lean meats. AVS provided to patient today for education/recommendation on gender specific health and safety maintenance. Colonoscopy: 08/16/2019. [facility]- 10 yr follow up. Mammogram: completed 06/26/2023- [facility] history of breast cancer[facility] Cervical cancer screening: Last Pap smear 05/29/2022 by gynecology.  Family history ovarian cancer. Dr. Lavoie Immunizations: tdap 01/2016 UTD, flu shot UTD -encouraged yearly, shingrix  # 1-she did not complete series, and doe snot desire to do so.  Infectious disease screening: HIV completed agreeable to hep c DEXA:  routine screen to start 60-65  Breast cancer screening by mammogram - MM 3D SCREENING MAMMOGRAM BILATERAL BREAST; Future  Lipid screening - Lipid panel Diabetes mellitus screening - Hemoglobin A1c Long term current use of hormonal contraceptive - Hemoglobin A1c - Lipid panel   Return in about 1 year (around 09/23/2024) for cpe (20 min).  Orders Placed This Encounter  Procedures   MM 3D SCREENING  MAMMOGRAM BILATERAL BREAST   CBC   Comprehensive metabolic panel with GFR   Hemoglobin A1c   Lipid panel   TSH   No orders of the defined types were placed in this encounter.  Referral Orders  No referral(s) requested today     Electronically signed by: Charlies Bellini, DO [facility] [place]

## 2023-09-24 ENCOUNTER — Ambulatory Visit: Payer: Self-pay | Admitting: Family Medicine

## 2023-09-30 ENCOUNTER — Other Ambulatory Visit: Payer: Self-pay | Admitting: Family Medicine

## 2023-10-02 DIAGNOSIS — F439 Reaction to severe stress, unspecified: Secondary | ICD-10-CM | POA: Diagnosis not present

## 2023-10-06 ENCOUNTER — Other Ambulatory Visit: Payer: Self-pay | Admitting: Family Medicine

## 2023-10-29 DIAGNOSIS — F439 Reaction to severe stress, unspecified: Secondary | ICD-10-CM | POA: Diagnosis not present

## 2023-11-04 ENCOUNTER — Telehealth: Payer: Self-pay | Admitting: Family Medicine

## 2023-11-04 NOTE — Telephone Encounter (Unsigned)
 Copied from CRM #[id number]. Topic: Clinical - Medication Refill >> Nov 04, 2023  9:30 AM Frederich PARAS wrote: Medication: flutiCasone  PROPIONATE   Has the patient contacted their pharmacy? Yes PT reached out to pharmacy within 1 week and they adv they would reach out to us     [id number] - [place], [place] - [address] [place]  [address] Phone: [phone] Fax: [phone]   Is this the correct pharmacy for this prescription? Yes If no, delete pharmacy and type the correct one.   Has the prescription been filled recently? Yes  Is the patient out of the medication? Yes  Has the patient been seen for an appointment in the last year OR does the patient have an upcoming appointment? Yes  Can we respond through MyChart? Yes  Agent: Please be advised that Rx refills may take up to 3 business days. We ask that you follow-up with your pharmacy.

## 2023-11-05 ENCOUNTER — Other Ambulatory Visit: Payer: Self-pay | Admitting: Family Medicine

## 2023-11-05 ENCOUNTER — Other Ambulatory Visit: Payer: Self-pay

## 2023-11-05 MED ORDER — FLUTICASONE PROPIONATE 50 MCG/ACT NA SUSP: 2.0000 | Freq: Every day | NASAL | 11 refills | Status: AC

## 2023-11-05 MED ORDER — FLUTICASONE PROPIONATE 50 MCG/ACT NA SUSP: 2.0000 | Freq: Every day | NASAL | 11 refills | Status: DC

## 2023-11-05 NOTE — Telephone Encounter (Signed)
 Refilled for her, this is also an OTC med.

## 2023-11-20 DIAGNOSIS — F439 Reaction to severe stress, unspecified: Secondary | ICD-10-CM | POA: Diagnosis not present

## 2023-11-25 ENCOUNTER — Ambulatory Visit: Admitting: Family Medicine

## 2023-11-25 ENCOUNTER — Encounter: Payer: Self-pay | Admitting: Family Medicine

## 2023-11-25 VITALS — BP 104/74 | HR 77 | Temp 98.3°F | Wt 130.6 lb

## 2023-11-25 DIAGNOSIS — Z23 Encounter for immunization: Secondary | ICD-10-CM | POA: Diagnosis not present

## 2023-11-25 DIAGNOSIS — M25519 Pain in unspecified shoulder: Secondary | ICD-10-CM

## 2023-11-25 MED ORDER — DICLOFENAC SODIUM 25 MG PO TBEC: 25.0000 mg | DELAYED_RELEASE_TABLET | Freq: Two times a day (BID) | ORAL | 5 refills | Status: AC

## 2023-11-25 NOTE — Patient Instructions (Signed)

## 2023-11-25 NOTE — Progress Notes (Signed)
 Carla Watts , 03-Nov-1965, 58 y.o., female MRN: [id number] Patient Care Team    Relationship Specialty Notifications Start End  Catherine Charlies LABOR, DO PCP - General Family Medicine  11/24/14   Curtis Debby PARAS, MD Consulting Physician Sports Medicine  05/19/20   Sheena Pugh, DO Consulting Physician Cardiology  09/23/23   Lavoie, Marie-Lyne, MD  Obstetrics and Gynecology  09/23/23   [facility]    09/23/23     Chief Complaint  Patient presents with   Shoulder Pain    R shoulder,  2 months. Pt has tried advil.      Subjective: Carla Watts is a 58 y.o. Pt presents for an OV with complaints of Right shoulder pain of 2 months duration.  Associated symptoms include intermittent pain that is worsening.  Patient can be reproduced if she is attempting to lift a light weight and abduct right arm.  She also noticed reproduction of pain when pushing with her right upper extremity.  She denies any radiation of pain down her arm.  She states that it is hard to localize the pain other than it is in her shoulder area. Patient has a prescription for diclofenac  75 mg BID for arthritis in the past, but only uses once a day sometimes. It can make her dizzy feeling. She was intolerant to mobic > rash.  Pt has tried advil to ease their symptoms.  Patient denies a particular event in which she knows she injured her shoulder.  But she is very athletic works out routinely.     09/23/2023    8:33 AM 08/13/2023    8:12 AM 01/02/2023    8:40 AM 01/03/2022    8:35 AM 07/04/2020    8:24 AM  Depression screen PHQ 2/9  Decreased Interest 0 0 0 0 0  Down, Depressed, Hopeless 0 0 0 0 0  PHQ - 2 Score 0 0 0 0 0    Allergies  Allergen Reactions   Meloxicam  Rash   Penicillins Rash   Social History   Social History Narrative   Married, 3 children.   - drinks some caffeine.   - uses herbal remedies at time.    - Wears a seatbelt, exercises 3x/w, smoke alarm in the home   - Guns in the  home, locked case   - Feels safe in relationship   Past Medical History:  Diagnosis Date   Abnormal Pap smear of cervix    Allergy    mild    Chicken pox    GERD (gastroesophageal reflux disease)    Migraines    during pregnancy   Palpitations    comes and goes    Past Surgical History:  Procedure Laterality Date   CERVICAL BIOPSY  W/ LOOP ELECTRODE EXCISION     CESAREAN SECTION     laparoscopy of uterus     OOPHORECTOMY Right 2002   dermoid cyst   Family History  Problem Relation Age of Onset   Hypertension Mother    Thyroid  disease Mother    Breast cancer Mother 44   Skin cancer Mother    Stroke Father    Skin cancer Father    Thyroid  disease Maternal Aunt    Ovarian cancer Paternal Aunt    Brain cancer Paternal Uncle    Arthritis Maternal Grandmother        RA   Thyroid  disease Maternal Grandmother    Heart attack Maternal Grandfather    Heart disease  Maternal Grandfather 18       Died of MI   Alcohol abuse Paternal Grandfather    Allergies as of 11/25/2023       Reactions   Meloxicam  Rash   Penicillins Rash        Medication List        Accurate as of November 25, 2023  1:02 PM. If you have any questions, ask your nurse or doctor.          diclofenac  25 MG EC tablet Commonly known as: VOLTAREN  Take 1 tablet (25 mg total) by mouth 2 (two) times daily. What changed:  medication strength how much to take Changed by: Tobechukwu Emmick   ELDERBERRY PO Take 1 tablet by mouth. Unknown strength   estradiol  0.025 MG/24HR Commonly known as: Dotti  Place 1 patch onto the skin 2 (two) times a week.   fluticasone  50 MCG/ACT nasal spray Commonly known as: FLONASE  Place 2 sprays into both nostrils daily.   norethindrone  0.35 MG tablet Commonly known as: MICRONOR  Take 1 tablet (0.35 mg total) by mouth daily.   Phexxi  1.8-1-0.4 % Gel Generic drug: Lactic Ac-Citric Ac-Pot Bitart Place 5 g vaginally as directed. Before intercourse   progesterone  100  MG capsule Commonly known as: PROMETRIUM  Take 1 capsule (100 mg total) by mouth daily.        All past medical history, surgical history, allergies, family history, immunizations andmedications were updated in the EMR today and reviewed under the history and medication portions of their EMR.     ROS Negative, with the exception of above mentioned in HPI   Objective:  BP 104/74   Pulse 77   Temp 98.3 F (36.8 C)   Wt 130 lb 9.6 oz (59.2 kg)   SpO2 98%   BMI 22.85 kg/m  Body mass index is 22.85 kg/m.  Physical Exam Vitals and nursing note reviewed.  Constitutional:      General: She is not in acute distress.    Appearance: Normal appearance. She is normal weight. She is not ill-appearing or toxic-appearing.  HENT:     Head: Normocephalic and atraumatic.  Eyes:     General: No scleral icterus.       Right eye: No discharge.        Left eye: No discharge.     Extraocular Movements: Extraocular movements intact.     Conjunctiva/sclera: Conjunctivae normal.     Pupils: Pupils are equal, round, and reactive to light.  Musculoskeletal:     Right shoulder: No swelling, deformity, tenderness, bony tenderness or crepitus. Normal strength.     Left shoulder: Normal.     Comments: Negative empty can test and hawkins. + O'b[place]. No TTP over bicipital groove or bone tenderness. FROM with discomfort after 90 degree abd-uction.    Skin:    Findings: No rash.  Neurological:     Mental Status: She is alert and oriented to person, place, and time. Mental status is at baseline.     Motor: No weakness.     Coordination: Coordination normal.     Gait: Gait normal.  Psychiatric:        Mood and Affect: Mood normal.        Behavior: Behavior normal.        Thought Content: Thought content normal.        Judgment: Judgment normal.      No results found. No results found. No results found for this or any previous visit (from the  past 24 hours).  Assessment/Plan: Carla Watts is a 58 y.o. female present for OV for  Acute shoulder pain, unspecified laterality (Primary) Suspect labral tear as dx, discussed referring to ortho and she is agreeable to that approach today - AMB referral to orthopedics - diclofenac  lower dose (25 mg BID) provided. Higher dose caused dizziness and she is allergic to alt.  Influenza vaccine:administered Prevnar 20:declined  Reviewed expectations re: course of current medical issues. Discussed self-management of symptoms. Outlined signs and symptoms indicating need for more acute intervention. Patient verbalized understanding and all questions were answered. Patient received an After-Visit Summary.    Orders Placed This Encounter  Procedures   Flu vaccine trivalent PF, 6mos and older(Flulaval,Afluria,Fluarix,Fluzone)   AMB referral to orthopedics   Meds ordered this encounter  Medications   diclofenac  (VOLTAREN ) 25 MG EC tablet    Sig: Take 1 tablet (25 mg total) by mouth 2 (two) times daily.    Dispense:  60 tablet    Refill:  5   Referral Orders         AMB referral to orthopedics       Note is dictated utilizing voice recognition software. Although note has been proof read prior to signing, occasional typographical errors still can be missed. If any questions arise, please do not hesitate to call for verification.   electronically signed by:  Charlies Bellini, DO  [facility] [place]

## 2023-11-27 DIAGNOSIS — M7541 Impingement syndrome of right shoulder: Secondary | ICD-10-CM | POA: Diagnosis not present

## 2023-11-27 DIAGNOSIS — M25511 Pain in right shoulder: Secondary | ICD-10-CM | POA: Diagnosis not present

## 2023-12-10 DIAGNOSIS — F439 Reaction to severe stress, unspecified: Secondary | ICD-10-CM | POA: Diagnosis not present

## 2023-12-10 DIAGNOSIS — R29898 Other symptoms and signs involving the musculoskeletal system: Secondary | ICD-10-CM | POA: Diagnosis not present

## 2023-12-10 DIAGNOSIS — M25511 Pain in right shoulder: Secondary | ICD-10-CM | POA: Diagnosis not present

## 2023-12-23 DIAGNOSIS — F439 Reaction to severe stress, unspecified: Secondary | ICD-10-CM | POA: Diagnosis not present

## 2023-12-26 DIAGNOSIS — R29898 Other symptoms and signs involving the musculoskeletal system: Secondary | ICD-10-CM | POA: Diagnosis not present

## 2023-12-26 DIAGNOSIS — M25511 Pain in right shoulder: Secondary | ICD-10-CM | POA: Diagnosis not present

## 2023-12-30 ENCOUNTER — Telehealth: Payer: Self-pay

## 2023-12-30 NOTE — Telephone Encounter (Signed)
 Copied from CRM [id number]. Topic: Clinical - Medication Question >> Dec 30, 2023 11:08 AM Aleatha BROCKS wrote: Reason for CRM: Patient wanted to micro dose trimetaizde and wanted to know Dr Catherine thoughts about that

## 2023-12-30 NOTE — Telephone Encounter (Signed)
 Spoke with pt to advise that she calls insurance to find out what all they will cover for weight management whether injectable or pill form so that we can better assist with her current frustration. Pt advised of mychart message for information that is needed at appt.

## 2023-12-30 NOTE — Telephone Encounter (Signed)
 Microdosing GLP 1 is not covered by insurances.  She will need to pay for the GLP out-of-pocket. To my knowledge there is only 1 pharmacy in the area that compounds tirzepatide to a daily microdose drop you placed under the tongue.  Last time I checked it was anywhere from 300-$500 a month

## 2023-12-31 DIAGNOSIS — M531 Cervicobrachial syndrome: Secondary | ICD-10-CM | POA: Diagnosis not present

## 2023-12-31 DIAGNOSIS — M9901 Segmental and somatic dysfunction of cervical region: Secondary | ICD-10-CM | POA: Diagnosis not present

## 2023-12-31 DIAGNOSIS — M9902 Segmental and somatic dysfunction of thoracic region: Secondary | ICD-10-CM | POA: Diagnosis not present

## 2024-01-02 ENCOUNTER — Encounter: Payer: Self-pay | Admitting: Family Medicine

## 2024-01-02 DIAGNOSIS — M25511 Pain in right shoulder: Secondary | ICD-10-CM | POA: Diagnosis not present

## 2024-01-02 DIAGNOSIS — R29898 Other symptoms and signs involving the musculoskeletal system: Secondary | ICD-10-CM | POA: Diagnosis not present

## 2024-01-02 DIAGNOSIS — F439 Reaction to severe stress, unspecified: Secondary | ICD-10-CM | POA: Diagnosis not present

## 2024-01-05 NOTE — Telephone Encounter (Signed)
Completed and returned to [place] work station

## 2024-01-05 NOTE — Telephone Encounter (Signed)
 Called and spoke with pt. Form sent to pt email as requested.

## 2024-01-15 ENCOUNTER — Encounter: Payer: Self-pay | Admitting: Family Medicine

## 2024-01-15 ENCOUNTER — Ambulatory Visit: Admitting: Family Medicine

## 2024-01-15 VITALS — BP 106/70 | HR 72 | Temp 98.2°F | Wt 133.0 lb

## 2024-01-15 DIAGNOSIS — F439 Reaction to severe stress, unspecified: Secondary | ICD-10-CM | POA: Diagnosis not present

## 2024-01-15 DIAGNOSIS — Z713 Dietary counseling and surveillance: Secondary | ICD-10-CM

## 2024-01-15 MED ORDER — ZEPBOUND 2.5 MG/0.5ML ~~LOC~~ SOLN: 2.5000 mg | SUBCUTANEOUS | 2 refills | Status: DC

## 2024-01-15 NOTE — Progress Notes (Signed)
 Carla Watts , [birth year], 58 y.o., female MRN: [id number] Patient Care Team    Relationship Specialty Notifications Start End  Catherine Charlies LABOR, DO PCP - General Family Medicine  11/24/14   Curtis Debby PARAS, MD Consulting Physician Sports Medicine  05/19/20   Tobb, Kardie, DO Consulting Physician Cardiology  09/23/23   [facility]    09/23/23     Chief Complaint  Patient presents with   Weight Management Screening    Wanting discuss low dose of GLP-1     Subjective: Carla Watts is a 58 y.o. Pt presents for an OV with weight loss counseling. Lbs: 133 BMI: 23.7 Exercise: Exercises 5 times a week Water: 60-80 ounces water daily Diet: Heart healthy diet Patient's goal: 125  Patient is interested in considering microdosing tirzepatide to help her lose the last 10 pounds.  She is extremely active and works out greater than 150 minutes a week.  She reports a healthy diet.  She feels her hormones are part of the cause of her difficulty in losing her maintaining her weight.  She is menopausal.  Patient reports there is a family history of thyroid  disease on her mother side of the family, but he does not believe any thyroid  cancers of medullary thyroid  cancer present in her family.  Patient denies any history of thyroid  tumors in herself or her family.     09/23/2023    8:33 AM 08/13/2023    8:12 AM 01/02/2023    8:40 AM 01/03/2022    8:35 AM 07/04/2020    8:24 AM  Depression screen PHQ 2/9  Decreased Interest 0 0 0 0 0  Down, Depressed, Hopeless 0 0 0 0 0  PHQ - 2 Score 0 0 0 0 0    Allergies  Allergen Reactions   Meloxicam  Rash   Penicillins Rash   Social History   Social History Narrative   Married, 3 children.   - drinks some caffeine.   - uses herbal remedies at time.    - Wears a seatbelt, exercises 3x/w, smoke alarm in the home   - Guns in the home, locked case   - Feels safe in relationship   Past Medical History:  Diagnosis Date    Abnormal Pap smear of cervix    Allergy    mild    Chicken pox    GERD (gastroesophageal reflux disease)    Migraines    during pregnancy   Palpitations    comes and goes    Past Surgical History:  Procedure Laterality Date   CERVICAL BIOPSY  W/ LOOP ELECTRODE EXCISION     CESAREAN SECTION     laparoscopy of uterus     OOPHORECTOMY Right 2002   dermoid cyst   Family History  Problem Relation Age of Onset   Hypertension Mother    Thyroid  disease Mother    Breast cancer Mother 78   Skin cancer Mother    Stroke Father    Skin cancer Father    Thyroid  disease Maternal Aunt    Ovarian cancer Paternal Aunt    Brain cancer Paternal Uncle    Arthritis Maternal Grandmother        RA   Thyroid  disease Maternal Grandmother    Heart attack Maternal Grandfather    Heart disease Maternal Grandfather 22       Died of MI   Alcohol abuse Paternal Grandfather    Allergies as of 01/15/2024  Reactions   Meloxicam  Rash   Penicillins Rash        Medication List        Accurate as of January 15, 2024 11:31 AM. If you have any questions, ask your nurse or doctor.          diclofenac  25 MG EC tablet Commonly known as: VOLTAREN  Take 1 tablet (25 mg total) by mouth 2 (two) times daily.   ELDERBERRY PO Take 1 tablet by mouth. Unknown strength   estradiol  0.025 MG/24HR Commonly known as: Dotti  Place 1 patch onto the skin 2 (two) times a week.   fluticasone  50 MCG/ACT nasal spray Commonly known as: FLONASE  Place 2 sprays into both nostrils daily.   norethindrone  0.35 MG tablet Commonly known as: MICRONOR  Take 1 tablet (0.35 mg total) by mouth daily.   Phexxi  1.8-1-0.4 % Gel Generic drug: Lactic Ac-Citric Ac-Pot Bitart Place 5 g vaginally as directed. Before intercourse   progesterone  100 MG capsule Commonly known as: PROMETRIUM  Take 1 capsule (100 mg total) by mouth daily.   Zepbound 2.5 MG/0.5ML injection vial Generic drug: tirzepatide Inject 2.5 mg into  the skin once a week. Started by: Charlies Bellini        All past medical history, surgical history, allergies, family history, immunizations andmedications were updated in the EMR today and reviewed under the history and medication portions of their EMR.     ROS Negative, with the exception of above mentioned in HPI   Objective:  BP 106/70   Pulse 72   Temp 98.2 F (36.8 C)   Wt 133 lb (60.3 kg)   SpO2 98%   BMI 23.27 kg/m  Body mass index is 23.27 kg/m.  Physical Exam Vitals and nursing note reviewed.  Constitutional:      General: She is not in acute distress.    Appearance: Normal appearance. She is normal weight. She is not ill-appearing or toxic-appearing.  HENT:     Head: Normocephalic and atraumatic.  Eyes:     General: No scleral icterus.       Right eye: No discharge.        Left eye: No discharge.     Extraocular Movements: Extraocular movements intact.     Conjunctiva/sclera: Conjunctivae normal.     Pupils: Pupils are equal, round, and reactive to light.  Skin:    Findings: No rash.  Neurological:     Mental Status: She is alert and oriented to person, place, and time. Mental status is at baseline.     Motor: No weakness.     Coordination: Coordination normal.     Gait: Gait normal.  Psychiatric:        Mood and Affect: Mood normal.        Behavior: Behavior normal.        Thought Content: Thought content normal.        Judgment: Judgment normal.      No results found. No results found. No results found for this or any previous visit (from the past 24 hours).  Assessment/Plan: Carla Watts is a 58 y.o. female present for OV for  Weight loss counseling, encounter for (Primary) Patient denies any history of medullary thyroid  cancer in herself or her family, no family history of neuroendocrine tumors. - Patient was counseled on exercise, calorie counting, weight loss and potential medications to help with weight loss today. -Patient was  provided with online resources for: Weekly net calorie calculator.  Applications for calorie counting.  Patient was advised to ensure she is taking in adequate nutrition daily by meeting calorie goals. -Patient was educated on dietary changes to not only lose weight but to eat healthy.   Patient was educated on low glycemic index diet. -Patient was educated on exercise goal of 150 minutes a week (plus warm up and cool down) of cardiovascular exercise.   -Patient was encouraged to maintain adequate water consumption of at least 80 ounces a day, more if exercising/sweating. -Zepbound 2.5 mg weekly injection prescribed x 3 months. Return in about 10 weeks (around 03/25/2024).   Reviewed expectations re: course of current medical issues. Discussed self-management of symptoms. Outlined signs and symptoms indicating need for more acute intervention. Patient verbalized understanding and all questions were answered. Patient received an After-Visit Summary.    No orders of the defined types were placed in this encounter.  Meds ordered this encounter  Medications   tirzepatide (ZEPBOUND) 2.5 MG/0.5ML injection vial    Sig: Inject 2.5 mg into the skin once a week.    Dispense:  2 mL    Refill:  2   Referral Orders  No referral(s) requested today     Note is dictated utilizing voice recognition software. Although note has been proof read prior to signing, occasional typographical errors still can be missed. If any questions arise, please do not hesitate to call for verification.   electronically signed by:  Charlies Bellini, DO  [facility] - [place]

## 2024-01-15 NOTE — Patient Instructions (Addendum)
 Insulin Syringe with Needle, 31G 1cc 5/16-Inch  [facility]   Return in about 10 weeks (around 03/25/2024).        Great to see you today.  I have refilled the medication(s) we provide.   If labs were collected or images ordered, we will inform you of  results once we have received them and reviewed. We will contact you either by echart message, or telephone call.  Please give ample time to the testing facility, and our office to run,  receive and review results. Please do not call inquiring of results, even if you can see them in your chart. We will contact you as soon as we are able. If it has been over 1 week since the test was completed, and you have not yet heard from us , then please call us .    - echart message- for normal results that have been seen by the patient already.   - telephone call: abnormal results or if patient has not viewed results in their echart.  If a referral to a specialist was entered for you, please call us  in 2 weeks if you have not heard from the specialist office to schedule.

## 2024-01-22 DIAGNOSIS — M25511 Pain in right shoulder: Secondary | ICD-10-CM | POA: Diagnosis not present

## 2024-02-03 DIAGNOSIS — M7541 Impingement syndrome of right shoulder: Secondary | ICD-10-CM | POA: Diagnosis not present

## 2024-02-18 DIAGNOSIS — F439 Reaction to severe stress, unspecified: Secondary | ICD-10-CM | POA: Diagnosis not present

## 2024-03-16 ENCOUNTER — Encounter: Payer: Self-pay | Admitting: Family Medicine

## 2024-03-26 ENCOUNTER — Other Ambulatory Visit: Payer: Self-pay | Admitting: Radiology

## 2024-03-26 DIAGNOSIS — N951 Menopausal and female climacteric states: Secondary | ICD-10-CM

## 2024-03-26 MED ORDER — ESTRADIOL 0.0375 MG/24HR TD PTTW: 1.0000 | MEDICATED_PATCH | TRANSDERMAL | 12 refills | Status: DC

## 2024-03-26 MED ORDER — ESTRADIOL 0.0375 MG/24HR TD PTTW: 1.0000 | MEDICATED_PATCH | TRANSDERMAL | 0 refills | Status: AC

## 2024-03-31 ENCOUNTER — Other Ambulatory Visit: Payer: Self-pay | Admitting: Family Medicine

## 2024-04-01 ENCOUNTER — Ambulatory Visit: Admitting: Family Medicine

## 2024-04-02 ENCOUNTER — Encounter: Payer: Self-pay | Admitting: Family Medicine

## 2024-04-02 ENCOUNTER — Ambulatory Visit: Admitting: Family Medicine

## 2024-04-02 VITALS — BP 104/68 | HR 75 | Temp 98.0°F | Wt 127.8 lb

## 2024-04-02 DIAGNOSIS — Z713 Dietary counseling and surveillance: Secondary | ICD-10-CM | POA: Diagnosis not present

## 2024-04-02 DIAGNOSIS — H9313 Tinnitus, bilateral: Secondary | ICD-10-CM | POA: Diagnosis not present

## 2024-04-02 MED ORDER — ZEPBOUND 2.5 MG/0.5ML ~~LOC~~ SOLN: 2.5000 mg | SUBCUTANEOUS | 5 refills | Status: AC

## 2024-04-02 NOTE — Patient Instructions (Signed)
 Return in about 24 weeks (around 09/17/2024) for Routine chronic condition follow-up.        Great to see you today.  I have refilled the medication(s) we provide.   If labs were collected or images ordered, we will inform you of  results once we have received them and reviewed. We will contact you either by echart message, or telephone call.  Please give ample time to the testing facility, and our office to run,  receive and review results. Please do not call inquiring of results, even if you can see them in your chart. We will contact you as soon as we are able. If it has been over 1 week since the test was completed, and you have not yet heard from us , then please call us .    - echart message- for normal results that have been seen by the patient already.   - telephone call: abnormal results or if patient has not viewed results in their echart.  If a referral to a specialist was entered for you, please call us  in 2 weeks if you have not heard from the specialist office to schedule.

## 2024-04-02 NOTE — Progress Notes (Signed)
 "      Carla Watts , [birth year], 59 y.o., female MRN: [id number] Patient Care Team    Relationship Specialty Notifications Start End  Catherine Charlies LABOR, DO PCP - General Family Medicine  11/24/14   Curtis Debby PARAS, MD Consulting Physician Sports Medicine  05/19/20   Sheena Pugh, DO Consulting Physician Cardiology  09/23/23   [facility]    09/23/23     Chief Complaint  Patient presents with   Weight Management Screening     Subjective: Sherrie Marsan is a 59 y.o. Pt presents for an OV with weight loss counseling. Lbs: 133>127 BMI: 23.7> 22.36 Exercise: Exercises 5 times a week Water: 60-80 ounces water daily Diet: Heart healthy diet Patient's goal: 125> patient at goal at home without shoes and her jacket  She is very happy with the results she has received over the last 6 weeks.. Patient is interested in considering microdosing tirzepatide  to help her lose the last 10 pounds.  She is extremely active and works out greater than 150 minutes a week.  She reports a healthy diet.  She feels her hormones are part of the cause of her difficulty in losing her maintaining her weight.  She is menopausal.  Patient reports there is a family history of thyroid  disease on her mother side of the family, but he does not believe any thyroid  cancers of medullary thyroid  cancer present in her family.  Patient denies any history of thyroid  tumors in herself or her family.     04/02/2024   11:06 AM 09/23/2023    8:33 AM 08/13/2023    8:12 AM 01/02/2023    8:40 AM 01/03/2022    8:35 AM  Depression screen PHQ 2/9  Decreased Interest 0 0 0 0 0  Down, Depressed, Hopeless 0 0 0 0 0  PHQ - 2 Score 0 0 0 0 0  Altered sleeping 0      Tired, decreased energy 0      Change in appetite 0      Feeling bad or failure about yourself  0      Trouble concentrating 0      Moving slowly or fidgety/restless 0      Suicidal thoughts 0      PHQ-9 Score 0      Difficult doing work/chores Not  difficult at all        Allergies  Allergen Reactions   Meloxicam  Rash   Penicillins Rash   Social History   Social History Narrative   Married, 3 children.   - drinks some caffeine.   - uses herbal remedies at time.    - Wears a seatbelt, exercises 3x/w, smoke alarm in the home   - Guns in the home, locked case   - Feels safe in relationship   Past Medical History:  Diagnosis Date   Abnormal Pap smear of cervix    Allergy    mild    Chicken pox    GERD (gastroesophageal reflux disease)    Migraines    during pregnancy   Palpitations    comes and goes    Past Surgical History:  Procedure Laterality Date   CERVICAL BIOPSY  W/ LOOP ELECTRODE EXCISION     CESAREAN SECTION     laparoscopy of uterus     OOPHORECTOMY Right 2002   dermoid cyst   Family History  Problem Relation Age of Onset   Hypertension Mother    Thyroid  disease Mother  Breast cancer Mother 29   Skin cancer Mother    Stroke Father    Skin cancer Father    Thyroid  disease Maternal Aunt    Ovarian cancer Paternal Aunt    Brain cancer Paternal Uncle    Arthritis Maternal Grandmother        RA   Thyroid  disease Maternal Grandmother    Heart attack Maternal Grandfather    Heart disease Maternal Grandfather 3       Died of MI   Alcohol abuse Paternal Grandfather    Allergies as of 04/02/2024       Reactions   Meloxicam  Rash   Penicillins Rash        Medication List        Accurate as of April 02, 2024  1:40 PM. If you have any questions, ask your nurse or doctor.          diclofenac  25 MG EC tablet Commonly known as: VOLTAREN  Take 1 tablet (25 mg total) by mouth 2 (two) times daily.   ELDERBERRY PO Take 1 tablet by mouth. Unknown strength   estradiol  0.0375 MG/24HR Commonly known as: Dotti  Place 1 patch onto the skin 2 (two) times a week.   fluticasone  50 MCG/ACT nasal spray Commonly known as: FLONASE  Place 2 sprays into both nostrils daily.   Phexxi  1.8-1-0.4 %  Gel Generic drug: Lactic Ac-Citric Ac-Pot Bitart Place 5 g vaginally as directed. Before intercourse   progesterone  100 MG capsule Commonly known as: PROMETRIUM  Take 1 capsule (100 mg total) by mouth daily.   Zepbound  2.5 MG/0.5ML injection vial Generic drug: tirzepatide  Inject 2.5 mg as directed once a week. What changed: See the new instructions. Changed by: Charlies Bellini, DO        All past medical history, surgical history, allergies, family history, immunizations andmedications were updated in the EMR today and reviewed under the history and medication portions of their EMR.     ROS Negative, with the exception of above mentioned in HPI   Objective:  BP 104/68   Pulse 75   Temp 98 F (36.7 C)   Wt 127 lb 12.8 oz (58 kg)   SpO2 98%   BMI 22.36 kg/m  Body mass index is 22.36 kg/m.  Physical Exam Vitals and nursing note reviewed.  Constitutional:      General: She is not in acute distress.    Appearance: Normal appearance. She is normal weight. She is not ill-appearing or toxic-appearing.  HENT:     Head: Normocephalic and atraumatic.  Eyes:     General: No scleral icterus.       Right eye: No discharge.        Left eye: No discharge.     Extraocular Movements: Extraocular movements intact.     Conjunctiva/sclera: Conjunctivae normal.     Pupils: Pupils are equal, round, and reactive to light.  Skin:    Findings: No rash.  Neurological:     Mental Status: She is alert and oriented to person, place, and time. Mental status is at baseline.     Motor: No weakness.     Coordination: Coordination normal.     Gait: Gait normal.  Psychiatric:        Mood and Affect: Mood normal.        Behavior: Behavior normal.        Thought Content: Thought content normal.        Judgment: Judgment normal.      No results  found. No results found. No results found for this or any previous visit (from the past 24 hours).  Assessment/Plan: Dinesha Twiggs is a 59  y.o. female present for OV for  Weight loss counseling, encounter for (Primary) Patient denies any history of medullary thyroid  cancer in herself or her family, no family history of neuroendocrine tumors. - Patient was counseled on exercise, calorie counting, weight loss and potential medications to help with weight loss today. -Patient was provided with online resources for: Weekly net calorie calculator.  Applications for calorie counting.  Patient was advised to ensure she is taking in adequate nutrition daily by meeting calorie goals. -Patient was educated on dietary changes to not only lose weight but to eat healthy.   Patient was educated on low glycemic index diet. -Patient was educated on exercise goal of 150 minutes a week (plus warm up and cool down) of cardiovascular exercise.   -Patient was encouraged to maintain adequate water consumption of at least 80 ounces a day, more if exercising/sweating. - Continue Zepbound  2.5 mg injection every 7-10 days as desired to maintain weight goal.  Tinnitus: Discussed starting Lipoflavonoid supplement can help She was taking NSAIDs to help with the shoulder injury, possibly could have contributed Will refer to ENT for further evaluation      Return in about 24 weeks (around 09/17/2024) for Routine chronic condition follow-up.   Reviewed expectations re: course of current medical issues. Discussed self-management of symptoms. Outlined signs and symptoms indicating need for more acute intervention. Patient verbalized understanding and all questions were answered. Patient received an After-Visit Summary.    Orders Placed This Encounter  Procedures   Ambulatory referral to ENT   Meds ordered this encounter  Medications   tirzepatide  (ZEPBOUND ) 2.5 MG/0.5ML injection vial    Sig: Inject 2.5 mg as directed once a week.    Dispense:  2 mL    Refill:  5   Referral Orders         Ambulatory referral to ENT       Note is dictated  utilizing voice recognition software. Although note has been proof read prior to signing, occasional typographical errors still can be missed. If any questions arise, please do not hesitate to call for verification.   electronically signed by:  Charlies Bellini, DO  [facility] [place]    "

## 2024-04-21 ENCOUNTER — Institutional Professional Consult (permissible substitution) (INDEPENDENT_AMBULATORY_CARE_PROVIDER_SITE_OTHER): Admitting: Physician Assistant
